# Patient Record
Sex: Male | Born: 1990 | Race: Black or African American | Hispanic: No | Marital: Single | Smoking: Never smoker
Health system: Southern US, Community
[De-identification: ages and names within clinical notes are randomized; demographics above are authoritative.]

## PROBLEM LIST (undated history)

## (undated) DIAGNOSIS — I10 Essential (primary) hypertension: Secondary | ICD-10-CM

## (undated) DIAGNOSIS — R7303 Prediabetes: Secondary | ICD-10-CM

## (undated) DIAGNOSIS — J45909 Unspecified asthma, uncomplicated: Secondary | ICD-10-CM

## (undated) HISTORY — PX: TYMPANOSTOMY TUBE PLACEMENT: SHX32

## (undated) HISTORY — PX: TONSILLECTOMY: SUR1361

---

## 2019-10-30 ENCOUNTER — Encounter: Payer: Self-pay | Admitting: Emergency Medicine

## 2019-10-30 ENCOUNTER — Other Ambulatory Visit: Payer: Self-pay

## 2019-10-30 ENCOUNTER — Ambulatory Visit
Admission: EM | Admit: 2019-10-30 | Discharge: 2019-10-30 | Disposition: A | Discharge status: Home / Self Care | Payer: Self-pay

## 2019-10-30 DIAGNOSIS — H6122 Impacted cerumen, left ear: Secondary | ICD-10-CM

## 2019-10-30 DIAGNOSIS — H6983 Other specified disorders of Eustachian tube, bilateral: Secondary | ICD-10-CM

## 2019-10-30 DIAGNOSIS — R42 Dizziness and giddiness: Secondary | ICD-10-CM

## 2019-10-30 DIAGNOSIS — R0981 Nasal congestion: Secondary | ICD-10-CM

## 2019-10-30 HISTORY — DX: Essential (primary) hypertension: I10

## 2019-10-30 HISTORY — DX: Prediabetes: R73.03

## 2019-10-30 MED ORDER — MECLIZINE HCL 25 MG PO TABS: 25.0000 mg | ORAL_TABLET | Freq: Three times a day (TID) | ORAL | 0 refills | Status: AC | PRN

## 2019-10-30 NOTE — ED Provider Notes (Signed)
[facility]    CSN: [id number] Arrival date & time: 10/30/19  1407      History   Chief Complaint Chief Complaint  Patient presents with  . Sinus Problem  . Dizziness    HPI Miguel Kent is a 29 y.o. male presenting for 5-day history of sinus pressure, nasal congestion and bilateral ear pressure.  He says that the left ear is worse than the right.  He says he feels little dizzy at times especially with certain movements of his head.  He denies any fever, cough, chest pain or breathing difficulty.  Patient does have a history of asthma, hypertension, T2DM, and obesity.  He says he has been taking over-the-counter Mucinex with improvement in his symptoms.  He says that he is actually feeling better.  He says that he thinks amoxicillin will help him feel better faster and he request that today.  Patient denies any known Covid exposure.  He has not been vaccinated for Covid.  He declines any Covid testing today because he says he is afraid to have the test done.  Patient has no other concerns today.  HPI  Past Medical History:  Diagnosis Date  . Hypertension   . Pre-diabetes     There are no problems to display for this patient.   Past Surgical History:  Procedure Laterality Date  . TONSILLECTOMY    . TYMPANOSTOMY TUBE PLACEMENT         Home Medications    Prior to Admission medications   Medication Sig Start Date End Date Taking? Authorizing Provider  atenolol-chlorthalidone (TENORETIC) 100-25 MG tablet Take 1 tablet by mouth daily.   Yes [provider]  losartan (COZAAR) 50 MG tablet Take 50 mg by mouth daily.   Yes [provider]  metFORMIN (GLUCOPHAGE) 850 MG tablet Take 850 mg by mouth 2 (two) times daily with a meal.   Yes [provider]  meclizine (ANTIVERT) 25 MG tablet Take 1 tablet (25 mg total) by mouth 3 (three) times daily as needed for up to 10 days for dizziness. 10/30/19 11/09/19  Shirlee Latch, PA-C    Family  History Family History  Problem Relation Age of Onset  . Healthy Mother   . Hypertension Father     Social History Social History   Tobacco Use  . Smoking status: Never Smoker  . Smokeless tobacco: Never Used  Vaping Use  . Vaping Use: Never used  Substance Use Topics  . Alcohol use: Not Currently  . Drug use: Not Currently     Allergies   Peanut oil   Review of Systems Review of Systems  Constitutional: Negative for fatigue and fever.  HENT: Positive for congestion, ear pain, rhinorrhea, sinus pressure and sinus pain. Negative for sore throat.   Respiratory: Negative for cough and shortness of breath.   Gastrointestinal: Negative for abdominal pain, diarrhea, nausea and vomiting.  Musculoskeletal: Negative for myalgias.  Neurological: Positive for dizziness. Negative for weakness, light-headedness and headaches.  Hematological: Negative for adenopathy.     Physical Exam Triage Vital Signs ED Triage Vitals  Enc Vitals Group     BP 10/30/19 1442 (!) 151/116     Pulse Rate 10/30/19 1442 100     Resp --      Temp 10/30/19 1442 98 F (36.7 C)     Temp Source 10/30/19 1442 Oral     SpO2 10/30/19 1442 100 %     Weight --  Height --      Head Circumference --      Peak Flow --      Pain Score 10/30/19 1437 7     Pain Loc --      Pain Edu? --      Excl. in GC? --    No data found.  Updated Vital Signs BP (!) 151/116 (BP Location: Left Arm)   Pulse 100   Temp 98 F (36.7 C) (Oral)   SpO2 100%       Physical Exam Vitals and nursing note reviewed.  Constitutional:      General: He is not in acute distress.    Appearance: Normal appearance. He is well-developed. He is obese. He is not ill-appearing, toxic-appearing or diaphoretic.  HENT:     Head: Normocephalic and atraumatic.     Right Ear: Tympanic membrane, ear canal and external ear normal.     Left Ear: External ear normal. There is impacted cerumen.     Nose: Congestion and rhinorrhea  present.     Mouth/Throat:     Pharynx: Uvula midline. No oropharyngeal exudate or posterior oropharyngeal erythema.     Tonsils: No tonsillar abscesses.  Eyes:     General: No scleral icterus.       Right eye: No discharge.        Left eye: No discharge.     Conjunctiva/sclera: Conjunctivae normal.     Pupils: Pupils are equal, round, and reactive to light.  Neck:     Thyroid: No thyromegaly.     Trachea: No tracheal deviation.  Cardiovascular:     Rate and Rhythm: Normal rate and regular rhythm.     Heart sounds: Normal heart sounds.  Pulmonary:     Effort: Pulmonary effort is normal. No respiratory distress.     Breath sounds: Normal breath sounds. No stridor. No wheezing or rales.  Chest:     Chest wall: No tenderness.  Musculoskeletal:     Cervical back: Normal range of motion and neck supple.  Lymphadenopathy:     Cervical: No cervical adenopathy.  Skin:    General: Skin is warm and dry.     Findings: No rash.  Neurological:     General: No focal deficit present.     Mental Status: He is alert. Mental status is at baseline.     Motor: No weakness.     Gait: Gait normal.  Psychiatric:        Mood and Affect: Mood normal.        Behavior: Behavior normal.        Thought Content: Thought content normal.      UC Treatments / Results  Labs (all labs ordered are listed, but only abnormal results are displayed) Labs Reviewed - No data to display  EKG   Radiology No results found.  Procedures Procedures (including critical care time)  Medications Ordered in UC Medications - No data to display  Initial Impression / Assessment and Plan / UC Course  I have reviewed the triage vital signs and the nursing notes.  Pertinent labs & imaging results that were available during my care of the patient were reviewed by me and considered in my medical decision making (see chart for details).   Patient presenting for sinus congestion and dizziness over the past few days.   Patient is high risk for Covid since he does have type 2 diabetes, hypertension, asthma, obesity and has not been vaccinated.  Spoke with  patient for about 10 minutes encouraging him to get Covid tested.  Patient declined stating that he was afraid to have the test done.  I discussed how the procedure is done and that it is not painful.  Advised him that since he is not vaccinated he is high risk for poor outcomes if he were to test positive for Covid, but he could benefit from medical antibody infusion therapy if positive and he get in within the first 7 days.  Patient again expressed that he did not want to get Covid tested and would consider it may be in the future.  Again, I spent much time discussing with him the importance of getting tested.  A week lavage performed on the left ear.  After all the cerumen removed, I could see the TM which showed small effusion without infection.  Treating patient at this time for eustachian tube dysfunction and otitis media effusion with meclizine.  Also encouraged him to continue use of nasal sprays.  Strongly encourage patient to follow back up with [place] for any new or worsening symptoms including fever, fatigue, cough, breathing difficulty, etc.  Patient agreeable.   Final Clinical Impressions(s) / UC Diagnoses   Final diagnoses:  Dysfunction of both eustachian tubes  Dizziness  Sinus congestion  Impacted cerumen of left ear   Discharge Instructions   None    ED Prescriptions    Medication Sig Dispense Auth. Provider   meclizine (ANTIVERT) 25 MG tablet Take 1 tablet (25 mg total) by mouth 3 (three) times daily as needed for up to 10 days for dizziness. 30 tablet Gareth Morgan     PDMP not reviewed this encounter.   Shirlee Latch, PA-C 10/31/19 2254

## 2019-10-30 NOTE — ED Triage Notes (Addendum)
Pt c/o sinus pressure, nasal congestion, productive cough with clear mucous, and dizziness onset last Saturday. Pt denies fever. Pt has been taking mucinex which has been helping. Pt declines covid testing today.

## 2020-03-01 ENCOUNTER — Encounter: Payer: Self-pay | Admitting: Physician Assistant

## 2020-03-01 ENCOUNTER — Other Ambulatory Visit: Payer: Self-pay

## 2020-03-01 ENCOUNTER — Ambulatory Visit: Payer: BC Managed Care – PPO | Admitting: Physician Assistant

## 2020-03-01 DIAGNOSIS — Z7689 Persons encountering health services in other specified circumstances: Secondary | ICD-10-CM

## 2020-03-01 DIAGNOSIS — J4520 Mild intermittent asthma, uncomplicated: Secondary | ICD-10-CM | POA: Diagnosis not present

## 2020-03-01 DIAGNOSIS — R7301 Impaired fasting glucose: Secondary | ICD-10-CM

## 2020-03-01 DIAGNOSIS — I10 Essential (primary) hypertension: Secondary | ICD-10-CM | POA: Diagnosis not present

## 2020-03-01 DIAGNOSIS — Z111 Encounter for screening for respiratory tuberculosis: Secondary | ICD-10-CM

## 2020-03-01 DIAGNOSIS — E1165 Type 2 diabetes mellitus with hyperglycemia: Secondary | ICD-10-CM | POA: Diagnosis not present

## 2020-03-01 DIAGNOSIS — G479 Sleep disorder, unspecified: Secondary | ICD-10-CM | POA: Diagnosis not present

## 2020-03-01 DIAGNOSIS — R5383 Other fatigue: Secondary | ICD-10-CM

## 2020-03-01 LAB — POCT GLYCOSYLATED HEMOGLOBIN (HGB A1C): Hemoglobin A1C: 7.5 % — AB (ref 4.0–5.6)

## 2020-03-01 MED ORDER — AMLODIPINE BESYLATE 5 MG PO TABS: 5.0000 mg | ORAL_TABLET | Freq: Every day | ORAL | 2 refills | Status: DC

## 2020-03-01 MED ORDER — ALBUTEROL SULFATE HFA 108 (90 BASE) MCG/ACT IN AERS: 2.0000 | INHALATION_SPRAY | Freq: Four times a day (QID) | RESPIRATORY_TRACT | 0 refills | Status: DC | PRN

## 2020-03-01 NOTE — Progress Notes (Signed)
[facility] [address] [place], [place] [address]  Internal MEDICINE  Office Visit Note  Patient Name: Miguel Kent  [id number]  [id number]  Date of Service: 03/02/2020   Complaints/HPI Pt is here for establishment of PCP. Chief Complaint  Patient presents with  . New Patient (Initial Visit)  . Hypertension   HPI  Pt is here to establish care. He was seen at [facility] previously who started him on BP meds and is switching offices now that he has insurance. 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 He has not been taking Losartan due to headaches. He is still taking tenoretic. He has also not been taking the Metformin. Also has asthma and has been using albuterol inhaler every other day. He has a nebulizer but has not used it. No maintenance inhaler used currently. He additionally reports that he snores and wakes up gasping. He has some sleepiness during the day.   Current Medication: Outpatient Encounter Medications as of 03/01/2020  Medication Sig  . albuterol (VENTOLIN HFA) 108 (90 Base) MCG/ACT inhaler Inhale 2 puffs into the lungs every 6 (six) hours as needed for wheezing or shortness of breath.  [name] amLODipine (NORVASC) 5 MG tablet Take 1 tablet (5 mg total) by mouth daily.  [name] atenolol-chlorthalidone (TENORETIC) 100-25 MG tablet Take 1 tablet by mouth daily.  [name] losartan (COZAAR) 50 MG tablet Take 50 mg by mouth daily.  . metFORMIN (GLUCOPHAGE) 850 MG tablet Take 850 mg by mouth 2 (two) times daily with a meal.   No facility-administered encounter medications on file as of 03/01/2020.    Surgical History: Past Surgical History:  Procedure Laterality Date  . TONSILLECTOMY    . TYMPANOSTOMY TUBE PLACEMENT      Medical History: Past Medical History:  Diagnosis Date  . Hypertension   . Pre-diabetes     Family History: Family History  Problem Relation Age of Onset  . Healthy Mother   . Hypertension Father      Social History   Socioeconomic History  . Marital status: Single    Spouse name: Not on file  . Number of children: Not on file  . Years of education: Not on file  . Highest education level: Not on file  Occupational History  . Not on file  Tobacco Use  . Smoking status: Never Smoker  . Smokeless tobacco: Never Used  Vaping Use  . Vaping Use: Never used  Substance and Sexual Activity  . Alcohol use: Never  . Drug use: Never  . Sexual activity: Not on file  Other Topics Concern  . Not on file  Social History Narrative  . Not on file   Social Determinants of Health   Financial Resource Strain: Not on file  Food Insecurity: Not on file  Transportation Needs: Not on file  Physical Activity: Not on file  Stress: Not on file  Social Connections: Not on file  Intimate Partner Violence: Not on file     Review of Systems  Constitutional: Negative for chills, fatigue and unexpected weight change.  HENT: Negative for congestion, hearing loss, postnasal drip, rhinorrhea, sneezing and sore throat.   Eyes: Negative for redness and visual disturbance.  Respiratory: Negative for cough, chest tightness and shortness of breath.   Cardiovascular: Negative for chest pain and palpitations.  Gastrointestinal: Negative for abdominal pain, constipation, diarrhea, nausea and vomiting.  Genitourinary: Negative for dysuria and frequency.  Musculoskeletal: Negative for arthralgias,  back pain, joint swelling and neck pain.  Skin: Negative for rash.  Neurological: Positive for headaches. Negative for tremors and numbness.  Hematological: Negative for adenopathy. Does not bruise/bleed easily.  Psychiatric/Behavioral: Positive for sleep disturbance. Negative for behavioral problems (Depression) and suicidal ideas. The patient is not nervous/anxious.     Vital Signs: BP (!) 177/111   Pulse 80   Temp (!) 97.5 F (36.4 C)   Resp 16   Ht 6' (1.829 m)   Wt 261 lb (118.4 kg)   SpO2 96%    BMI 35.40 kg/m    Physical Exam Constitutional:      General: He is not in acute distress.    Appearance: He is well-developed. He is obese. He is not diaphoretic.  HENT:     Head: Normocephalic and atraumatic.     Mouth/Throat:     Pharynx: No oropharyngeal exudate.  Eyes:     Pupils: Pupils are equal, round, and reactive to light.  Neck:     Thyroid: No thyromegaly.     Vascular: No JVD.     Trachea: No tracheal deviation.  Cardiovascular:     Rate and Rhythm: Normal rate and regular rhythm.     Heart sounds: Normal heart sounds. No murmur heard. No friction rub. No gallop.   Pulmonary:     Effort: Pulmonary effort is normal. No respiratory distress.     Breath sounds: No wheezing or rales.  Chest:     Chest wall: No tenderness.  Abdominal:     General: Bowel sounds are normal.     Palpations: Abdomen is soft.  Musculoskeletal:        General: Normal range of motion.     Cervical back: Normal range of motion and neck supple.  Lymphadenopathy:     Cervical: No cervical adenopathy.  Skin:    General: Skin is warm and dry.  Neurological:     Mental Status: He is alert and oriented to person, place, and time.     Cranial Nerves: No cranial nerve deficit.  Psychiatric:        Behavior: Behavior normal.        Thought Content: Thought content normal.        Judgment: Judgment normal.       Assessment/Plan: 1. Uncontrolled type 2 diabetes mellitus with hyperglycemia (HCC) - POCT HgB A1C is 7.5 today. Per patient he has metformin at home, but has not been taking it. He will restart today. Will consider starting trulicty 1x/wk instead.  2. Uncontrolled hypertension He has not been taking losartan due to headaches and is fearful of taking an ACE due to be told that it can cause angioedema. He will continue atenolol-chlorthalidone 100-25mg  and add Norvasc daily. Needs recheck of BP in a few days before he can be cleared for his job paperwork. - amLODipine (NORVASC) 5 MG  tablet; Take 1 tablet (5 mg total) by mouth daily.  Dispense: 30 tablet; Refill: 2  3. Mild intermittent asthma, unspecified whether complicated Pt will bring in information of previous maintenance inhaler and nebulizer information. May need spiro next visit to check lung function. - albuterol (VENTOLIN HFA) 108 (90 Base) MCG/ACT inhaler; Inhale 2 puffs into the lungs every 6 (six) hours as needed for wheezing or shortness of breath.  Dispense: 8 g; Refill: 0  4. Sleep disturbance - PSG SLEEP STUDY; Future. Patient has sign and symptoms of OSA ( disturbed sleep, excessive fatigue during the day, uncontrolled bp and abnormal  BMI). Baseline sleep study is ordered to further look into this. Long term complications of OSA was addressed with the patient.  5. Fatigue, unspecified type Ordered age appropriate lab to be done prior to physical. - PSG SLEEP STUDY; Future - CBC with Differential/Platelet; Future - Lipid Panel With LDL/HDL Ratio; Future - TSH; Future - T4, free; Future - Comprehensive metabolic panel  6. Encounter for TB tine test Pt instructed to go to [facility] to have this done. - QuantiFERON-TB Gold Plus  7. Encounter to establish care Pt has paperwork to be signed to begin new job. He will go for TB Quant test ASAP and obtain remaining labwork prior to physical. He know she needs to obtain vaccination records for his paperwork to be signed off and must have BP better controlled at f/u in 2 days. - QuantiFERON-TB Gold Plus - CBC with Differential/Platelet; Future - Lipid Panel With LDL/HDL Ratio; Future - TSH; Future - T4, free; Future - Comprehensive metabolic panel General Counseling: Zakaria verbalizes understanding of the findings of todays visit and agrees with plan of treatment. I have discussed any further diagnostic evaluation that may be needed or ordered today. We also reviewed his medications today. he has been encouraged to call the office with any questions or  concerns that should arise related to todays visit.  Counseling: Hypertension Counseling:   The following hypertensive lifestyle modification were recommended and discussed:  1. Limiting alcohol intake to less than 1 oz/day of ethanol:(24 oz of beer or 8 oz of wine or 2 oz of 100-proof whiskey). 2. Take baby ASA 81 mg daily. 3. Importance of regular aerobic exercise and losing weight. 4. Reduce dietary saturated fat and cholesterol intake for overall cardiovascular health. 5. Maintaining adequate dietary potassium, calcium, and magnesium intake. 6. Regular monitoring of the blood pressure. 7. Reduce sodium intake to less than 100 mmol/day (less than 2.3 gm of sodium or less than 6 gm of sodium choride)    Orders Placed This Encounter  Procedures  . QuantiFERON-TB Gold Plus  . CBC with Differential/Platelet  . Lipid Panel With LDL/HDL Ratio  . TSH  . T4, free  . Comprehensive metabolic panel  . POCT HgB A1C  . PSG SLEEP STUDY    Meds ordered this encounter  Medications  . albuterol (VENTOLIN HFA) 108 (90 Base) MCG/ACT inhaler    Sig: Inhale 2 puffs into the lungs every 6 (six) hours as needed for wheezing or shortness of breath.    Dispense:  8 g    Refill:  0  . amLODipine (NORVASC) 5 MG tablet    Sig: Take 1 tablet (5 mg total) by mouth daily.    Dispense:  30 tablet    Refill:  2    Time spent:30 Minutes

## 2020-03-04 ENCOUNTER — Ambulatory Visit: Payer: Self-pay | Admitting: Physician Assistant

## 2020-03-23 ENCOUNTER — Encounter (INDEPENDENT_AMBULATORY_CARE_PROVIDER_SITE_OTHER): Payer: BC Managed Care – PPO | Admitting: Internal Medicine

## 2020-03-23 DIAGNOSIS — G4733 Obstructive sleep apnea (adult) (pediatric): Secondary | ICD-10-CM

## 2020-04-04 NOTE — Procedures (Signed)
[facility]  Polysomnogram Report Part I                                                               Phone: [phone] Fax: [phone]  Patient Name: Miguel Kent, Miguel Kent: [id number]  Date of Birth: [birth year] Acquisition Date: 03/23/2020  Referring Physician: Leeanne Deed, NP     History: The patient is a 30 year old male who was referred for evaluation of possible sleep apnea. Medical History: hypertension, sleep apnea.  Medications: Ventolin, Norvasc, Cozaar, tenoretic.  Procedure: This routine overnight polysomnogram was performed on the [facility] 5 using the standard diagnostic protocol. This included 6 channels of EEG, 2 channels of EOG, chin EMG, bilateral anterior tibialis EMG, nasal/oral thermistor, PTAF (nasal pressure transducer), chest and abdominal wall movements, EKG, and pulse oximetry.  Description: The total recording time was 426.2 minutes. The total sleep time was 304.0 minutes. There were a total of 99.6 minutes of wakefulness after sleep onset for a reduced?sleep efficiency of 71.3%. The latency to sleep onset was within normal limits?? at 22.6 minutes. The R sleep onset latency was prolonged at 139.0 minutes. Sleep parameters, as a percentage of the total sleep time, demonstrated 5.9% of sleep was in N1 sleep, 75.7% N2, 8.2% N3 and 10.2% R sleep. There were a total of 26 arousals for an arousal index of 5.1 arousals per hour of sleep that was normal.???  Respiratory monitoring demonstrated nearly continuous mild to severe degree of snoring in all positions. There were 237 apneas and hypopneas for an Apnea Hypopnea Index of 46.8 apneas and hypopneas per hour of sleep. The REM related apnea hypopnea index was 56.1/hr of REM sleep compared to a NREM AHI of 45.5/hr. The Respiratory Disturbance Index, which includes 6 respiratory effort related arousals (RERAs), was 48.0 respiratory events per hour of sleep.  The average  duration of the respiratory events was 27.6 seconds with a maximum duration of 91.5 seconds. The respiratory events occurred in all positions, however they were more frequent in the supine position with an AHI of 61.0. The respiratory events were associated with peripheral oxygen desaturations on the average to 87%. The lowest oxygen desaturation associated with a respiratory event was 50%. Additionally, the baseline oxygen saturation during wakefulness was 96%, during NREM sleep averaged 92%, and during REM sleep averaged  86%. The total duration of oxygen < 90% was 57.2 minutes and <80% was 8.3 minutes.  Cardiac monitoring- did demonstrate transient cardiac decelerations associated with the apneas. There were no significant cardiac rhythm irregularities.   Periodic limb movement monitoring- did not demonstrate periodic limb movements.     Impression: ???This routine overnight polysomnogram demonstrated significant obstructive sleep apnea with an overall Apnea Hypopnea Index of 46.8 apneas and hypopneas per hour of sleep, which increased to 61.0 in the supine position.  There was a reduced sleep efficiency with??increased awakenings?and reduced percentages of REM and slow wave sleep. ?These findings would appear to be due to the obstructive sleep apnea.???  ??Recommendations:    1. A CPAP titration would be recommended due to the severity of the sleep apnea. Some supine sleep should be ensured to optimize the titration. 2. Additionally,  would recommend weight loss in a patient with a BMI of 34.4.     Yevonne Pax, MD, [facility] Diplomate ABMS-Pulmonary, Critical Care and Sleep Medicine  Electronically reviewed and digitally signed     [facility] Polysomnogram Report Part II  Phone: [phone] Fax: [phone]  Patient last name [name] Neck Size 18.5 in. Acquisition [phone]  Patient first name Miguel Kent Weight 261.0 lbs. Started 03/23/2020 at 10:05:07 PM  Birth date  1990/12/07 Height 73.0 in. Stopped 03/24/2020 at 5:20:25 AM  Age 72 BMI 34.4 lb/in2 Duration 426.2  Study Type Adult      Report generated by: Hampton Abbot, RPSGT Sleep Data: Lights Out: 10:08:31 PM Sleep Onset: 10:31:07 PM  Lights On: 5:14:43 AM Sleep Efficiency: 71.3 %  Total Recording Time: 426.2 min Sleep Latency (from Lights Off) 22.6 min  Total Sleep Time (TST): 304.0 min R Latency (from Sleep Onset): 139.0 min  Sleep Period Time: 388.5 min Total Kent of awakenings: 17  Wake during sleep: 84.5 min Wake After Sleep Onset (WASO): 99.6 min   Sleep Data:         Arousal Summary: Stage  Latency from lights out (min) Latency from sleep onset (min) Duration (min) % Total Sleep Time  Normal values  N 1 22.6 0.0 18.0 5.9 (5%)  N 2 23.1 0.5 230.0 75.7 (50%)  N 3 139.6 117.0 25.0 8.2 (20%)  R 161.6 139.0 31.0 10.2 (25%)    Kent Index  Spontaneous 39 7.7  Apneas & Hypopneas 43 8.5  RERAs 6 1.2       (Apneas & Hypopneas & RERAs)  (49) (9.7)  Limb Movement 0 0.0  Snore 0 0.0  TOTAL 88 17.4      Respiratory Data:  CA OA MA Apnea Hypopnea* A+ H RERA Total  Kent 0 35 1 36 201 237 6 243  Mean Dur (sec) 0.0 21.1 12.0 20.8 29.0 27.8 22.5 27.6  Max Dur (sec) 0.0 91.5 12.0 91.5 59.0 91.5 30.0 91.5  Total Dur (min) 0.0 12.3 0.2 12.5 97.2 109.7 2.3 112.0  % of TST 0.0 4.0 0.1 4.1 32.0 36.1 0.7 36.8  Index (#/h TST) 0.0 6.9 0.2 7.1 39.7 46.8 1.2 48.0  *Hypopneas scored based on 4% or greater desaturation.  Sleep Stage:        REM NREM TST  AHI 56.1 45.5 46.8  RDI 56.1 46.8 48.0            Body Position Data:  Sleep (min) TST (%) REM (min) NREM (min) CA (#) OA (#) MA (#) HYP (#) AHI (#/h) RERA (#) RDI (#/h) Desat (#)  Supine 100.3 32.99 1.5 98.8 0 4 0 98 61.0 1 61.6 109  Non-Supine 203.70 67.01 29.50 174.20 0.00 31.00 1.00 103.00 39.76 5.00 41.24 139.00  Right: 203.7 67.01 29.5 174.2 0 31 1 103 39.8 5 41.2 139     Snoring: Total Kent of snoring episodes   0  Total time with snoring    min (   % of sleep)   Oximetry Distribution:             WK REM NREM TOTAL  Average (%)   96 86 92 93  < 90% 1.1 15.5 40.6 57.2  < 80% 0.7 6.9 0.7 8.3  < 70% 0.7 2.6 0.6 3.9  # of Desaturations* 3 34 211 248  Desat Index (#/hour) 1.6 65.8 46.4 49.0  Desat Max (%) 6 42 35 42  Desat Max Dur (  sec) 91.0 113.0 107.0 113.0  Approx Min O2 during sleep 50  Approx min O2 during a respiratory event 50  Was Oxygen added (Y/N) and final rate No:   0 LPM  *Desaturations based on 3% or greater drop from baseline.   Cheyne Stokes Breathing: None Present   Heart Rate Summary:  Average Heart Rate During Sleep 74.4 bpm      Highest Heart Rate During Sleep (95th %) 85.0 bpm      Highest Heart Rate During Sleep 190 bpm (artifact)  Highest Heart Rate During Recording (TIB) 190 bpm (artifact)   Heart Rate Observations: Event Type # Events   Bradycardia 0 Lowest HR Scored: N/A  Sinus Tachycardia During Sleep 0 Highest HR Scored: N/A  Narrow Complex Tachycardia 0 Highest HR Scored: N/A  Wide Complex Tachycardia 0 Highest HR Scored: N/A  Asystole 0 Longest Pause: N/A  Atrial Fibrillation 0 Duration Longest Event: N/A  Other Arrythmias  No Type:    Periodic Limb Movement Data: (Primary legs unless otherwise noted) Total # Limb Movement 0 Limb Movement Index 0.0  Total # PLMS    PLMS Index     Total # PLMS Arousals    PLMS Arousal Index     Percentage Sleep Time with PLMS   min (   % sleep)  Mean Duration limb movements (secs)

## 2020-04-15 ENCOUNTER — Encounter: Payer: Self-pay | Admitting: Physician Assistant

## 2020-04-22 ENCOUNTER — Other Ambulatory Visit: Payer: Self-pay

## 2020-04-22 ENCOUNTER — Ambulatory Visit: Payer: BC Managed Care – PPO | Admitting: Hospice and Palliative Medicine

## 2020-04-22 ENCOUNTER — Encounter: Payer: Self-pay | Admitting: Hospice and Palliative Medicine

## 2020-04-22 VITALS — BP 140/90 | HR 79 | Temp 97.7°F | Resp 16 | Ht 72.0 in | Wt 262.4 lb

## 2020-04-22 DIAGNOSIS — R5383 Other fatigue: Secondary | ICD-10-CM | POA: Diagnosis not present

## 2020-04-22 DIAGNOSIS — I10 Essential (primary) hypertension: Secondary | ICD-10-CM

## 2020-04-22 DIAGNOSIS — G4733 Obstructive sleep apnea (adult) (pediatric): Secondary | ICD-10-CM | POA: Diagnosis not present

## 2020-04-22 NOTE — Progress Notes (Signed)
[facility] [address] [place], [place] [address]  Internal MEDICINE  Office Visit Note  Patient Name: Miguel Kent  [id number]  [id number]  Date of Service: 04/23/2020  Chief Complaint  Patient presents with  . Follow-up    Sleep apnea, ss results     HPI Patient is here for routine follow-up Wanting to discuss his PSG results, upset that no one has called to reach out about results yet Had a follow-up with ENT whom explained that based on his anatomy his OSA is likely severe which concerned him and his mother ENT feels his symptoms of ear popping and headaches are likely exacerbated by severe OSA and will improve with CPAP use  PSG results overall AHI 46.8 which increased in supine position to 61.0, lowest SpO2 50%   BP improved since starting amlodipine at last visit Has not yet had a chance to have his labs completed yet   Current Medication: Outpatient Encounter Medications as of 04/22/2020  Medication Sig  . albuterol (VENTOLIN HFA) 108 (90 Base) MCG/ACT inhaler Inhale 2 puffs into the lungs every 6 (six) hours as needed for wheezing or shortness of breath.  [name] amLODipine (NORVASC) 5 MG tablet Take 1 tablet (5 mg total) by mouth daily.  [name] atenolol-chlorthalidone (TENORETIC) 100-25 MG tablet Take 1 tablet by mouth daily.  . metFORMIN (GLUCOPHAGE) 850 MG tablet Take 850 mg by mouth 2 (two) times daily with a meal.  . [DISCONTINUED] losartan (COZAAR) 50 MG tablet Take 50 mg by mouth daily. (Patient not taking: Reported on 04/22/2020)   No facility-administered encounter medications on file as of 04/22/2020.    Surgical History: Past Surgical History:  Procedure Laterality Date  . TONSILLECTOMY    . TYMPANOSTOMY TUBE PLACEMENT      Medical History: Past Medical History:  Diagnosis Date  . Hypertension   . Pre-diabetes     Family History: Family History  Problem Relation Age of Onset  . Healthy Mother   . Hypertension Father     Social History    Socioeconomic History  . Marital status: Single    Spouse name: Not on file  . Number of children: Not on file  . Years of education: Not on file  . Highest education level: Not on file  Occupational History  . Not on file  Tobacco Use  . Smoking status: Never Smoker  . Smokeless tobacco: Never Used  Vaping Use  . Vaping Use: Never used  Substance and Sexual Activity  . Alcohol use: Never  . Drug use: Never  . Sexual activity: Not on file  Other Topics Concern  . Not on file  Social History Narrative  . Not on file   Social Determinants of Health   Financial Resource Strain: Not on file  Food Insecurity: Not on file  Transportation Needs: Not on file  Physical Activity: Not on file  Stress: Not on file  Social Connections: Not on file  Intimate Partner Violence: Not on file      Review of Systems  Constitutional: Positive for fatigue. Negative for chills and unexpected weight change.  HENT: Negative for congestion, postnasal drip, rhinorrhea, sneezing and sore throat.   Eyes: Negative for redness.  Respiratory: Negative for cough, chest tightness and shortness of breath.   Cardiovascular: Negative for chest pain and palpitations.  Gastrointestinal: Negative for abdominal pain, constipation, diarrhea, nausea and vomiting.  Genitourinary: Negative for dysuria and frequency.  Musculoskeletal: Negative for arthralgias, back pain, joint swelling and  neck pain.  Skin: Negative for rash.  Neurological: Positive for headaches. Negative for tremors and numbness.  Hematological: Negative for adenopathy. Does not bruise/bleed easily.  Psychiatric/Behavioral: Negative for behavioral problems (Depression), sleep disturbance and suicidal ideas. The patient is not nervous/anxious.     Vital Signs: BP 140/90   Pulse 79   Temp 97.7 F (36.5 C)   Resp 16   Ht 6' (1.829 m)   Wt 262 lb 6.4 oz (119 kg)   SpO2 98%   BMI 35.59 kg/m    Physical Exam Vitals reviewed.   Constitutional:      Appearance: He is normal weight.  HENT:     Nose: Congestion present.  Cardiovascular:     Rate and Rhythm: Normal rate and regular rhythm.     Pulses: Normal pulses.     Heart sounds: Normal heart sounds.  Pulmonary:     Effort: Pulmonary effort is normal.     Breath sounds: Normal breath sounds.  Abdominal:     General: Abdomen is flat.     Palpations: Abdomen is soft.  Musculoskeletal:        General: Normal range of motion.     Cervical back: Normal range of motion.  Skin:    General: Skin is warm.  Neurological:     General: No focal deficit present.     Mental Status: He is alert and oriented to person, place, and time. Mental status is at baseline.  Psychiatric:        Mood and Affect: Mood normal.        Behavior: Behavior normal.        Thought Content: Thought content normal.        Judgment: Judgment normal.    Assessment/Plan: 1. OSA (obstructive sleep apnea) Due to severity of OSA will expedite process of getting him set up for CPAP titration study - Cpap titration; Future  2. Essential hypertension BP better controlled with addition of amlodipine, continue to monitor  3. Other fatigue - CBC with Differential/Platelet - Comprehensive metabolic panel - Lipid Panel With LDL/HDL Ratio - TSH + free T4  General Counseling: Jonan verbalizes understanding of the findings of todays visit and agrees with plan of treatment. I have discussed any further diagnostic evaluation that may be needed or ordered today. We also reviewed his medications today. he has been encouraged to call the office with any questions or concerns that should arise related to todays visit.    Orders Placed This Encounter  Procedures  . CBC with Differential/Platelet  . Comprehensive metabolic panel  . Lipid Panel With LDL/HDL Ratio  . TSH + free T4  . Cpap titration   Time spent: 30 Minutes Time spent includes review of chart, medications, test results and  follow-up plan with the patient.  This patient was seen by Leeanne Deed AGNP-C in Collaboration with Dr Lyndon Code as a part of collaborative care agreement     Lubertha Basque. Magdalynn Davilla AGNP-C Internal medicine

## 2020-04-23 ENCOUNTER — Encounter: Payer: Self-pay | Admitting: Hospice and Palliative Medicine

## 2020-05-06 ENCOUNTER — Encounter (INDEPENDENT_AMBULATORY_CARE_PROVIDER_SITE_OTHER): Payer: BC Managed Care – PPO | Admitting: Internal Medicine

## 2020-05-06 DIAGNOSIS — G4733 Obstructive sleep apnea (adult) (pediatric): Secondary | ICD-10-CM | POA: Diagnosis not present

## 2020-05-09 NOTE — Procedures (Signed)
[facility]  Polysomnogram Report Part I  Phone: [phone] Fax: [phone]  Patient Name: Miguel Kent, Miguel Kent Acquisition Number: [id number]  Date of Birth: 05/05/1990 Acquisition Date: 05/06/2020  Referring Physician: Leeanne Deed, NP     History: The patient is a 30 year old male with obstructive sleep apnea for CPAP titration. Medical History: hypertension, sleep apnea.  Medications: Ventolin, Norvasc, Cozaar, tenoretic.  Procedure: This routine overnight polysomnogram was performed on the [facility] 5 using the standard CPAP protocol. This included 6 channels of EEG, 2 channels of EOG, chin EMG, bilateral anterior tibialis EMG, nasal/oral thermistor, PTAF (nasal pressure transducer), chest and abdominal wall movements, EKG, and pulse oximetry.  Description: The total recording time was 452.5 minutes. The total sleep time was 325.7 minutes. There were a total of 114.0 minutes of wakefulness after sleep onset for a?reduced????sleep efficiency of 72.0%. The latency to sleep onset was within normal limits?at 12.8 minutes. The R sleep onset latency was within normal limits at 110.5 minutes.?? Sleep parameters, as a percentage of the total sleep time, demonstrated 2.8% of sleep was in N1 sleep, 40.1% N2, 27.6% N3 and 29.5% R sleep. There were a total of 33 arousals for an arousal index of 6.1 arousals per hour of sleep that was normal.???  Overall, there were a total of 120 respiratory events for a respiratory disturbance index, which includes apneas, hypopneas and RERAs (increased respiratory effort) of 22.1 respiratory events per hour of sleep during the pressure titration. CPAP was initiated at 5 cm H2O at lights out, 9:33 p.m. It was titrated in 1-2 cm increments for snoring and REM related obstructive events to the final pressure of 10 cm H2O. Obstructive events persisted in REM sleep at this pressure. The patient had difficulty tolerating the pressure  Additionally,  the baseline oxygen saturation during wakefulness was 96%, during NREM sleep averaged 93%, and during REM sleep averaged 91%. The total duration of oxygen < 90% was 28.1 minutes and <80% was 4.0 minutes.  Cardiac monitoring- intermittent PVCs were observed throughout the study.   Periodic limb movement monitoring- did not demonstrate periodic limb movements.   Impression: This patient's obstructive sleep apnea was not fully controlled at the final pressure setting of 10 cm H2O. The patient had difficulty tolerating the pressures and a higher pressure could not be titrated. The patient was also very congested when arriving in the laboratory.     Recommendations: 1. Would recommend utilization of nasal CPAP at 6-20 cm H2O. Practice with CPAP for 20-30 minutes daily until acclimated.     2. Treat nasal congesting regularly. 3. A [employer] Simplus mask was used. Chin strap used during study- no. Humidifier used during study- yes.     Yevonne Pax, MD, [facility] Diplomate ABMS-Pulmonary, Critical Care and Sleep Medicine  Electronically reviewed and digitally signed      [facility] CPAP/BIPAP Polysomnogram Report Part II Phone: [phone] Fax: [phone]  Patient last name Ayo Neck Size 18.5 in. Acquisition [id number]  Patient first name Miguel Kent Weight 261.0 lbs. Started 05/06/2020 at 9:28:30 PM  Birth date Mar 20, 1990 Height 73.0 in. Stopped 05/07/2020 at 5:09:36 AM  Age 30      Type Adult BMI 34.4 lb/in2 Duration 452.5  Report generated by: Hampton Abbot, RPSGT Sleep Data: Lights Out: 9:33:12 PM Sleep Onset: 9:46:00 PM  Lights On: 5:05:42 AM Sleep Efficiency: 72.0 %  Total Recording Time: 452.5 min Sleep  Latency (from Lights Off) 12.8 min  Total Sleep Time (TST): 325.7 min R Latency (from Sleep Onset): 110.5 min  Sleep Period Time: 439.7 min Total number of awakenings: 14  Wake during sleep: 114.0 min Wake After Sleep Onset (WASO): 114.0 min   Sleep Data:          Arousal Summary: Stage  Latency from lights out (min) Latency from sleep onset (min) Duration (min) % Total Sleep Time  Normal values  N 1 12.8 0.0 9.0 2.8 (5%)  N 2 13.8 1.0 130.7 40.1 (50%)  N 3 74.3 61.5 90.0 27.6 (20%)  R 123.3 110.5 96.0 29.5 (25%)    Number Index  Spontaneous 34 6.3  Apneas & Hypopneas 5 0.9  RERAs 0 0.0       (Apneas & Hypopneas & RERAs)  (5) (0.9)  Limb Movement 0 0.0  Snore 0 0.0  TOTAL 39 7.2      Respiratory Data:  CA OA MA Apnea Hypopnea* A+ H RERA Total  Number 0 79 0 79 41 120 0 120  Mean Dur (sec) 0.0 16.8 0.0 16.8 21.8 18.5 0.0 18.5  Max Dur (sec) 0.0 48.5 0.0 48.5 37.5 48.5 0.0 48.5  Total Dur (min) 0.0 22.1 0.0 22.1 14.9 37.0 0.0 37.0  % of TST 0.0 6.8 0.0 6.8 4.6 11.4 0.0 11.4  Index (#/h TST) 0.0 14.6 0.0 14.6 7.6 22.1 0.0 22.1  *Hypopneas scored based on 4% or greater desaturation.  Sleep Stage:         REM NREM TST  AHI 62.5 5.2 22.1  RDI 62.5 5.2 22.1    Sleep (min) TST (%) REM (min) NREM (min) CA (#) OA (#) MA (#) HYP (#) AHI (#/h) RERA (#) RDI (#/h) Desat (#)  Supine 128.3 39.39 55.1 73.2 0 59 0 22 37.9 0 37.9 67  Non-Supine 197.40 60.61 40.90 156.50 0.00 20.00 0.00 19.00 11.85 0 11.85 17.00  Right: 197.2 60.55 40.8 156.4 0 20 0 19 11.9 0 11.9 17  UP: 0.2 0.06 0.1 0.1 0 0 0 0 0.0 0 0.00 0     Snoring: Total number of snoring episodes  0  Total time with snoring    min (   % of sleep)   Oximetry Distribution:             WK REM NREM TOTAL  Average (%)   96 91 93 93  < 90% 0.1 25.1 2.9 28.1  < 80% 0.0 4.0 0.0 4.0  < 70% 0.0 0.3 0.0 0.3  # of Desaturations* 0 54 11 65  Desat Index (#/hour) 0.0 34.9 2.9 12.1  Desat Max (%) 0 12 6 12   Desat Max Dur (sec) 0.0 29.0 30.5 30.5  Approx Min O2 during sleep 64  Approx min O2 during a respiratory event 64  Was Oxygen added (Y/N) and final rate No:   0 LPM  *Desaturations based on 3% or greater drop from baseline.   Cheyne Stokes Breathing: None Present    Heart Rate Summary:  Average Heart Rate During Sleep 73.3 bpm      Highest Heart Rate During Sleep (95th %) 85.0 bpm      Highest Heart Rate During Sleep 179 bpm (artifact)  Highest Heart Rate During Recording (TIB) 229 bpm (artifact)   Heart Rate Observations: Event Type # Events   Bradycardia 0 Lowest HR Scored: N/A  Sinus Tachycardia During Sleep 0 Highest HR Scored: N/A  Narrow Complex Tachycardia 0 Highest  HR Scored: N/A  Wide Complex Tachycardia 0 Highest HR Scored: N/A  Asystole 0 Longest Pause: N/A  Atrial Fibrillation 0 Duration Longest Event: N/A  Other Arrythmias  No Type:   Periodic Limb Movement Data: (Primary legs unless otherwise noted) Total # Limb Movement 0 Limb Movement Index 0.0  Total # PLMS    PLMS Index     Total # PLMS Arousals    PLMS Arousal Index     Percentage Sleep Time with PLMS   min (   % sleep)  Mean Duration limb movements (secs)       IPAP Level (cmH2O) EPAP Level (cmH2O) Total Duration (min) Sleep Duration (min) Sleep (%) REM (%) CA  #) OA # MA # HYP #) AHI (#/hr) RERAs # RERAs (#/hr) RDI (#/hr)  0 0 8.2 0.0 0.0 0.0 0 0 0 0 0.0 0 0.0 0.0  5 5 64.6 24.1 37.3 0.0 0 0 0 1 2.5 0 0.0 2.5  6 6  107.3 96.0 89.5 4.5 0 14 0 3 10.6 0 0.0 10.6  7 7  32.1 27.5 85.7 49.5 0 11 0 5 34.9 0 0.0 34.9  8 8  52.7 52.7 100.0 46.7 0 19 0 9 31.9 0 0.0 31.9  9 0 5.5 0.0 0.0 0.0 0 0 0 0 0.0 0 0.0 0.0  9 9 63.6 35.0 55.0 46.2 0 12 0 14 44.6 0 0.0 44.6  10 10  102.0 87.5 85.8 19.6 0 20 0 9 19.9 0 0.0 19.9

## 2020-05-21 ENCOUNTER — Other Ambulatory Visit: Payer: Self-pay | Admitting: Physician Assistant

## 2020-05-21 DIAGNOSIS — J4520 Mild intermittent asthma, uncomplicated: Secondary | ICD-10-CM

## 2020-06-03 ENCOUNTER — Encounter: Payer: Self-pay | Admitting: Nurse Practitioner

## 2020-06-03 ENCOUNTER — Ambulatory Visit: Payer: BC Managed Care – PPO | Admitting: Nurse Practitioner

## 2020-06-03 ENCOUNTER — Other Ambulatory Visit: Payer: Self-pay

## 2020-06-03 VITALS — BP 150/74 | HR 96 | Temp 98.9°F | Resp 16 | Ht 72.0 in | Wt 255.8 lb

## 2020-06-03 DIAGNOSIS — J019 Acute sinusitis, unspecified: Secondary | ICD-10-CM

## 2020-06-03 DIAGNOSIS — Z91010 Allergy to peanuts: Secondary | ICD-10-CM

## 2020-06-03 DIAGNOSIS — J301 Allergic rhinitis due to pollen: Secondary | ICD-10-CM

## 2020-06-03 DIAGNOSIS — S161XXA Strain of muscle, fascia and tendon at neck level, initial encounter: Secondary | ICD-10-CM

## 2020-06-03 DIAGNOSIS — I10 Essential (primary) hypertension: Secondary | ICD-10-CM | POA: Diagnosis not present

## 2020-06-03 DIAGNOSIS — Z9989 Dependence on other enabling machines and devices: Secondary | ICD-10-CM

## 2020-06-03 DIAGNOSIS — G4733 Obstructive sleep apnea (adult) (pediatric): Secondary | ICD-10-CM | POA: Diagnosis not present

## 2020-06-03 DIAGNOSIS — J4520 Mild intermittent asthma, uncomplicated: Secondary | ICD-10-CM

## 2020-06-03 MED ORDER — ATENOLOL 100 MG PO TABS: 100.0000 mg | ORAL_TABLET | Freq: Every day | ORAL | 3 refills | Status: DC

## 2020-06-03 MED ORDER — AMOXICILLIN-POT CLAVULANATE 875-125 MG PO TABS: 1.0000 | ORAL_TABLET | Freq: Two times a day (BID) | ORAL | 0 refills | Status: DC

## 2020-06-03 MED ORDER — EPINEPHRINE 0.3 MG/0.3ML IJ SOAJ: 0.3000 mg | INTRAMUSCULAR | 1 refills | Status: DC | PRN

## 2020-06-03 MED ORDER — ALBUTEROL SULFATE HFA 108 (90 BASE) MCG/ACT IN AERS: INHALATION_SPRAY | RESPIRATORY_TRACT | 0 refills | Status: DC

## 2020-06-03 MED ORDER — CETIRIZINE HCL 10 MG PO TABS: 10.0000 mg | ORAL_TABLET | Freq: Every day | ORAL | 11 refills | Status: DC

## 2020-06-03 MED ORDER — CHLORTHALIDONE 25 MG PO TABS: 25.0000 mg | ORAL_TABLET | Freq: Every day | ORAL | 0 refills | Status: DC

## 2020-06-03 MED ORDER — METAXALONE 800 MG PO TABS: 800.0000 mg | ORAL_TABLET | Freq: Three times a day (TID) | ORAL | 0 refills | Status: AC | PRN

## 2020-06-03 NOTE — Progress Notes (Signed)
[facility] [address] [place], [place] [address]  Internal MEDICINE  Office Visit Note  Patient Name: Miguel Kent  [id number]  [id number]  Date of Service: 06/04/2020  Chief Complaint  Patient presents with  . Follow-up  . Hypertension    HPI [name] presents for a follow up visit regarding hypertension, OSA, and refills.  Reviewed sleep study, Using cpap at home, doing better -[name] also reports having symptoms of sinus infection - x1 week, nasal congestion, sinus pain/pressure, headache, sore throat, ear fullness.  Was treated for sinus infection with amoxicillin in march 2022.     Current Medication: Outpatient Encounter Medications as of 06/03/2020  Medication Sig  . amLODipine (NORVASC) 5 MG tablet Take 1 tablet (5 mg total) by mouth daily.  [name] amoxicillin-clavulanate (AUGMENTIN) 875-125 MG tablet Take 1 tablet by mouth 2 (two) times daily for 10 days. May crush and mix in food if needed.  [name] atenolol (TENORMIN) 100 MG tablet Take 1 tablet (100 mg total) by mouth daily.  [name] azelastine (ASTELIN) 0.1 % nasal spray Place 1 spray into both nostrils 2 (two) times daily for 7 days. Use in each nostril as directed  . cetirizine (ZYRTEC) 10 MG tablet Take 1 tablet (10 mg total) by mouth daily.  . chlorthalidone (HYGROTON) 25 MG tablet Take 1 tablet (25 mg total) by mouth daily.  [name] EPINEPHrine 0.3 mg/0.3 mL IJ SOAJ injection Inject 0.3 mg into the muscle as needed for anaphylaxis.  . metaxalone (SKELAXIN) 800 MG tablet Take 1 tablet (800 mg total) by mouth 3 (three) times daily as needed for up to 14 days for muscle spasms.  . metFORMIN (GLUCOPHAGE) 850 MG tablet Take 850 mg by mouth 2 (two) times daily with a meal.  . [DISCONTINUED] albuterol (VENTOLIN HFA) 108 (90 Base) MCG/ACT inhaler INHALE 2 PUFFS BY MOUTH EVERY 6 HOURS AS NEEDED FOR WHEEZING AND FOR SHORTNESS OF BREATH  . [DISCONTINUED] atenolol-chlorthalidone (TENORETIC) 100-25 MG tablet Take 1 tablet by mouth  daily.  [name] albuterol (VENTOLIN HFA) 108 (90 Base) MCG/ACT inhaler INHALE 2 PUFFS BY MOUTH EVERY 6 HOURS AS NEEDED FOR WHEEZING AND FOR SHORTNESS OF BREATH   No facility-administered encounter medications on file as of 06/03/2020.    Surgical History: Past Surgical History:  Procedure Laterality Date  . TONSILLECTOMY    . TYMPANOSTOMY TUBE PLACEMENT      Medical History: Past Medical History:  Diagnosis Date  . Hypertension   . Pre-diabetes     Family History: Family History  Problem Relation Age of Onset  . Healthy Mother   . Hypertension Father     Social History   Socioeconomic History  . Marital status: Single    Spouse name: Not on file  . Number of children: Not on file  . Years of education: Not on file  . Highest education level: Not on file  Occupational History  . Not on file  Tobacco Use  . Smoking status: Never Smoker  . Smokeless tobacco: Never Used  Vaping Use  . Vaping Use: Never used  Substance and Sexual Activity  . Alcohol use: Never  . Drug use: Never  . Sexual activity: Not on file  Other Topics Concern  . Not on file  Social History Narrative  . Not on file   Social Determinants of Health   Financial Resource Strain: Not on file  Food Insecurity: Not on file  Transportation Needs: Not on file  Physical Activity: Not on file  Stress:  Not on file  Social Connections: Not on file  Intimate Partner Violence: Not on file      Review of Systems  Constitutional: Negative.  Negative for fatigue and fever.  HENT: Positive for congestion, postnasal drip, sinus pressure, sinus pain and sore throat. Negative for mouth sores.   Eyes: Negative.   Respiratory: Positive for chest tightness and wheezing. Negative for cough.   Cardiovascular: Negative.  Negative for chest pain.  Gastrointestinal: Negative.   Genitourinary: Negative for flank pain.  Musculoskeletal: Negative for arthralgias.  Psychiatric/Behavioral: Negative.     Vital  Signs: BP (!) 150/74   Pulse 96   Temp 98.9 F (37.2 C)   Resp 16   Ht 6' (1.829 m)   Wt 255 lb 12.8 oz (116 kg)   SpO2 99%   BMI 34.69 kg/m    Physical Exam Constitutional:      Appearance: Normal appearance.  HENT:     Head: Normocephalic and atraumatic.     Right Ear: Tympanic membrane, ear canal and external ear normal.     Left Ear: Tympanic membrane, ear canal and external ear normal.     Nose: Congestion present.     Mouth/Throat:     Mouth: Mucous membranes are dry.     Pharynx: Posterior oropharyngeal erythema present.  Neurological:     Mental Status: He is alert.        Assessment/Plan: 1. Acute non-recurrent sinusitis, unspecified location The patient was recently treated in march 2022 for sinusitis with amoxicillin and azelastine nasal spray by his ENT.  Amoxicillin-clavulanate ordered for a 10 day course. Nasal spray reordered.  - amoxicillin-clavulanate (AUGMENTIN) 875-125 MG tablet; Take 1 tablet by mouth 2 (two) times daily for 10 days. May crush and mix in food if needed.  Dispense: 20 tablet; Refill: 0 - azelastine (ASTELIN) 0.1 % nasal spray; Place 1 spray into both nostrils 2 (two) times daily for 7 days. Use in each nostril as directed  Dispense: 2.1 mL; Refill: 0  2. OSA on CPAP [name] has his sleep study done and is using his CPAP at night. He reports that it has been helping. Results of the sleep study and CPAP titration were reviewed with the patient.   3. History of peanut allergy [name] has a peanut allergy with history of anaphylactic reactions. He does not have an epi pen at this time. Epi pen prescription sent to the pharmacy.  - EPINEPHrine 0.3 mg/0.3 mL IJ SOAJ injection; Inject 0.3 mg into the muscle as needed for anaphylaxis.  Dispense: 1 each; Refill: 1  4. Strain of neck muscle, initial encounter [name] is having neck muscle pain and tightness. Muscle relaxant prescribed for 14 days. - metaxalone (SKELAXIN) 800 MG tablet; Take 1  tablet (800 mg total) by mouth 3 (three) times daily as needed for up to 14 days for muscle spasms.  Dispense: 42 tablet; Refill: 0  5. Mild intermittent asthma, unspecified whether complicated Alexy has mild intermittent asthma. Needs a refill on his rescue inhaler. - albuterol (VENTOLIN HFA) 108 (90 Base) MCG/ACT inhaler; INHALE 2 PUFFS BY MOUTH EVERY 6 HOURS AS NEEDED FOR WHEEZING AND FOR SHORTNESS OF BREATH  Dispense: 18 g; Refill: 0  6. Hay fever Sulayman has seasonal allergies and uses cetirizine for alleviate symptoms. Refill ordered.  - cetirizine (ZYRTEC) 10 MG tablet; Take 1 tablet (10 mg total) by mouth daily.  Dispense: 30 tablet; Refill: 11  7. Benign hypertension Refilled Tenormin and Chlorthalidone ( can cause  hypokalemia) will check metb )   General Counseling: Joeziah verbalizes understanding of the findings of todays visit and agrees with plan of treatment. I have discussed any further diagnostic evaluation that may be needed or ordered today. We also reviewed his medications today. he has been encouraged to call the office with any questions or concerns that should arise related to todays visit.    No orders of the defined types were placed in this encounter.   Meds ordered this encounter  Medications  . atenolol (TENORMIN) 100 MG tablet    Sig: Take 1 tablet (100 mg total) by mouth daily.    Dispense:  90 tablet    Refill:  3  . chlorthalidone (HYGROTON) 25 MG tablet    Sig: Take 1 tablet (25 mg total) by mouth daily.    Dispense:  90 tablet    Refill:  0  . amoxicillin-clavulanate (AUGMENTIN) 875-125 MG tablet    Sig: Take 1 tablet by mouth 2 (two) times daily for 10 days. May crush and mix in food if needed.    Dispense:  20 tablet    Refill:  0  . EPINEPHrine 0.3 mg/0.3 mL IJ SOAJ injection    Sig: Inject 0.3 mg into the muscle as needed for anaphylaxis.    Dispense:  1 each    Refill:  1  . metaxalone (SKELAXIN) 800 MG tablet    Sig: Take 1 tablet (800  mg total) by mouth 3 (three) times daily as needed for up to 14 days for muscle spasms.    Dispense:  42 tablet    Refill:  0  . cetirizine (ZYRTEC) 10 MG tablet    Sig: Take 1 tablet (10 mg total) by mouth daily.    Dispense:  30 tablet    Refill:  11  . albuterol (VENTOLIN HFA) 108 (90 Base) MCG/ACT inhaler    Sig: INHALE 2 PUFFS BY MOUTH EVERY 6 HOURS AS NEEDED FOR WHEEZING AND FOR SHORTNESS OF BREATH    Dispense:  18 g    Refill:  0  . azelastine (ASTELIN) 0.1 % nasal spray    Sig: Place 1 spray into both nostrils 2 (two) times daily for 7 days. Use in each nostril as directed    Dispense:  2.1 mL    Refill:  0   Return if symptoms worsen or fail to improve.   Total time spent: 30 Minutes Time spent includes review of chart, medications, test results, and follow up plan with the patient.   [place] Controlled Substance Database was reviewed by me.   Dr Lyndon Code Internal medicine

## 2020-06-04 MED ORDER — AZELASTINE HCL 0.1 % NA SOLN: 1.0000 | Freq: Two times a day (BID) | NASAL | 0 refills | Status: DC

## 2020-06-12 ENCOUNTER — Other Ambulatory Visit: Payer: Self-pay

## 2020-06-12 ENCOUNTER — Ambulatory Visit
Admission: EM | Admit: 2020-06-12 | Discharge: 2020-06-12 | Disposition: A | Discharge status: Home / Self Care | Payer: BC Managed Care – PPO | Attending: Family Medicine | Admitting: Family Medicine

## 2020-06-12 DIAGNOSIS — U071 COVID-19: Secondary | ICD-10-CM

## 2020-06-12 HISTORY — DX: Unspecified asthma, uncomplicated: J45.909

## 2020-06-12 LAB — RESP PANEL BY RT-PCR (FLU A&B, COVID) ARPGX2
Influenza A by PCR: NEGATIVE
Influenza B by PCR: NEGATIVE
SARS Coronavirus 2 by RT PCR: POSITIVE — AB

## 2020-06-12 MED ORDER — MOLNUPIRAVIR EUA 200MG CAPSULE: 4.0000 | ORAL_CAPSULE | Freq: Two times a day (BID) | ORAL | 0 refills | Status: AC

## 2020-06-12 NOTE — ED Triage Notes (Addendum)
Pt c/o nasal congestion and cough since Thursday. Pt also reports chills, diarrhea, headache and bilateral ear pain - states his ears "feel wet" inside. Pt denies f/v/d or other symptoms.

## 2020-06-12 NOTE — ED Provider Notes (Signed)
[facility]    CSN: [id number] Arrival date & time: 06/12/20  0945      History   Chief Complaint Chief Complaint  Patient presents with  . Cough  . Nasal Congestion   HPI  30 year old male presents with respiratory symptoms.  Patient has been sick since Thursday.  He reports fever, chills, cough, congestion, headache, ear pain, diarrhea.  No reported sick contacts.  He does work in the school system.  Pain 8/10 in severity.  No relieving factors.  No other reported symptoms.  No other complaints.  Past Medical History:  Diagnosis Date  . Asthma   . Hypertension   . Pre-diabetes    Past Surgical History:  Procedure Laterality Date  . TONSILLECTOMY    . TYMPANOSTOMY TUBE PLACEMENT     Home Medications    Prior to Admission medications   Medication Sig Start Date End Date Taking? Authorizing Provider  albuterol (VENTOLIN HFA) 108 (90 Base) MCG/ACT inhaler INHALE 2 PUFFS BY MOUTH EVERY 6 HOURS AS NEEDED FOR WHEEZING AND FOR SHORTNESS OF BREATH 06/03/20  Yes Abernathy, Alyssa, NP  amLODipine (NORVASC) 5 MG tablet Take 1 tablet (5 mg total) by mouth daily. 03/01/20  Yes McDonough, Lauren K, PA-C  atenolol (TENORMIN) 100 MG tablet Take 1 tablet (100 mg total) by mouth daily. 06/03/20  Yes Abernathy, Arlyss Repress, NP  chlorthalidone (HYGROTON) 25 MG tablet Take 1 tablet (25 mg total) by mouth daily. 06/03/20  Yes Abernathy, Arlyss Repress, NP  EPINEPHrine 0.3 mg/0.3 mL IJ SOAJ injection Inject 0.3 mg into the muscle as needed for anaphylaxis. 06/03/20  Yes Abernathy, Arlyss Repress, NP  Fluticasone-Salmeterol (ADVAIR) 100-50 MCG/DOSE AEPB 2 (two) times daily. 03/28/20  Yes [provider]  metaxalone (SKELAXIN) 800 MG tablet Take 1 tablet (800 mg total) by mouth 3 (three) times daily as needed for up to 14 days for muscle spasms. 06/03/20 06/17/20 Yes Abernathy, Arlyss Repress, NP  metFORMIN (GLUCOPHAGE) 850 MG tablet Take 850 mg by mouth 2 (two) times daily with a meal.   Yes [provider]  molnupiravir EUA 200 mg CAPS Take 4 capsules (800 mg total) by mouth 2 (two) times daily for 5 days. 06/12/20 06/17/20 Yes Petra Sargeant G, DO  azelastine (ASTELIN) 0.1 % nasal spray Place 1 spray into both nostrils 2 (two) times daily for 7 days. Use in each nostril as directed 06/04/20 06/11/20  Sallyanne Kuster, NP  cetirizine (ZYRTEC) 10 MG tablet Take 1 tablet (10 mg total) by mouth daily. 06/03/20   Sallyanne Kuster, NP    Family History Family History  Problem Relation Age of Onset  . Healthy Mother   . Hypertension Father     Social History Social History   Tobacco Use  . Smoking status: Never Smoker  . Smokeless tobacco: Never Used  Vaping Use  . Vaping Use: Never used  Substance Use Topics  . Alcohol use: Never  . Drug use: Never     Allergies   Peanut oil and Peanut-containing drug products   Review of Systems Review of Systems Per HPI  Physical Exam Triage Vital Signs ED Triage Vitals  Enc Vitals Group     BP 06/12/20 1003 (!) 141/112     Pulse Rate 06/12/20 1003 (!) 113     Resp 06/12/20 1003 18     Temp 06/12/20 1003 (!) 100.7 F (38.2 C)     Temp Source 06/12/20 1003 Oral     SpO2 06/12/20 1003 98 %  Weight 06/12/20 0958 255 lb (115.7 kg)     Height 06/12/20 0958 6' (1.829 m)     Head Circumference --      Peak Flow --      Pain Score 06/12/20 0957 8     Pain Loc --      Pain Edu? --      Excl. in GC? --    Updated Vital Signs BP (!) 141/112 (BP Location: Left Arm)   Pulse (!) 113   Temp (!) 100.7 F (38.2 C) (Oral)   Resp 18   Ht 6' (1.829 m)   Wt 115.7 kg   SpO2 98%   BMI 34.58 kg/m   Visual Acuity Right Eye Distance:   Left Eye Distance:   Bilateral Distance:    Right Eye Near:   Left Eye Near:    Bilateral Near:     Physical Exam Vitals and nursing note reviewed.  Constitutional:      General: He is not in acute distress.    Appearance: He is obese.  HENT:     Head: Normocephalic and atraumatic.     Nose:  Congestion present.     Mouth/Throat:     Pharynx: Oropharynx is clear.  Cardiovascular:     Rate and Rhythm: Regular rhythm. Tachycardia present.  Pulmonary:     Effort: Pulmonary effort is normal.     Breath sounds: Normal breath sounds. No wheezing, rhonchi or rales.  Neurological:     Mental Status: He is alert.  Psychiatric:        Mood and Affect: Mood normal.        Behavior: Behavior normal.      UC Treatments / Results  Labs (all labs ordered are listed, but only abnormal results are displayed) Labs Reviewed  RESP PANEL BY RT-PCR (FLU A&B, COVID) ARPGX2 - Abnormal; Notable for the following components:      Result Value   SARS Coronavirus 2 by RT PCR POSITIVE (*)    All other components within normal limits    EKG   Radiology No results found.  Procedures Procedures (including critical care time)  Medications Ordered in UC Medications - No data to display  Initial Impression / Assessment and Plan / UC Course  I have reviewed the triage vital signs and the nursing notes.  Pertinent labs & imaging results that were available during my care of the patient were reviewed by me and considered in my medical decision making (see chart for details).    30 year old male presents with COVID-19.  This is an acute illness with systemic symptoms.  After discussion of therapeutic options as he is high risk, he elected to proceed with molnupiravir.  Rx sent to the pharmacy.  Final Clinical Impressions(s) / UC Diagnoses   Final diagnoses:  COVID     Discharge Instructions     Rest. Fluids.  Stay home.  Medication as prescribed.  Take care  Dr. Adriana Simas    ED Prescriptions    Medication Sig Dispense Auth. Provider   molnupiravir EUA 200 mg CAPS Take 4 capsules (800 mg total) by mouth 2 (two) times daily for 5 days. 40 capsule Tommie Sams, DO     PDMP not reviewed this encounter.   Tommie Sams, [place] 06/12/20 1131

## 2020-06-12 NOTE — Discharge Instructions (Addendum)
Rest. Fluids.  Stay home.  Medication as prescribed.  Take care  Dr. Adriana Simas

## 2020-06-14 ENCOUNTER — Telehealth: Payer: Self-pay

## 2020-06-14 NOTE — Telephone Encounter (Signed)
LMOM for pt to call us back to let us know how he is feeling per DFK.

## 2020-06-14 NOTE — Telephone Encounter (Signed)
-----   Message from Fozia M Khan, MD sent at 06/12/2020  6:25 PM EDT ----- He tested positive for covid, please call on Monday to see how is he doing   

## 2020-06-16 ENCOUNTER — Telehealth: Payer: Self-pay

## 2020-06-16 NOTE — Telephone Encounter (Signed)
Spoke to pt and he states he is feeling a little better but is not symptom free yet.  He asked if we could give him a dr note to extend going to work a little longer.  I advised for him to call the urgent care back and see if they will do the note and if not to call us back.

## 2020-06-16 NOTE — Telephone Encounter (Signed)
-----   Message from Lyndon Code, MD sent at 06/12/2020  6:25 PM EDT ----- He tested positive for covid, please call on Monday to see how is he doing

## 2020-07-16 ENCOUNTER — Ambulatory Visit: Payer: BC Managed Care – PPO | Admitting: Nurse Practitioner

## 2020-07-16 VITALS — Ht 72.0 in | Wt 255.0 lb

## 2020-07-16 DIAGNOSIS — R42 Dizziness and giddiness: Secondary | ICD-10-CM

## 2020-07-16 DIAGNOSIS — J4520 Mild intermittent asthma, uncomplicated: Secondary | ICD-10-CM | POA: Diagnosis not present

## 2020-07-16 DIAGNOSIS — J0141 Acute recurrent pansinusitis: Secondary | ICD-10-CM | POA: Diagnosis not present

## 2020-07-16 DIAGNOSIS — L309 Dermatitis, unspecified: Secondary | ICD-10-CM

## 2020-07-16 MED ORDER — ALBUTEROL SULFATE HFA 108 (90 BASE) MCG/ACT IN AERS: INHALATION_SPRAY | RESPIRATORY_TRACT | 0 refills | Status: DC

## 2020-07-16 MED ORDER — GUAIFENESIN 200 MG/5ML PO LIQD: 5.0000 mL | Freq: Four times a day (QID) | ORAL | 0 refills | Status: DC | PRN

## 2020-07-16 MED ORDER — MECLIZINE HCL 25 MG PO TABS: 25.0000 mg | ORAL_TABLET | Freq: Two times a day (BID) | ORAL | 0 refills | Status: DC | PRN

## 2020-07-16 MED ORDER — DOXYCYCLINE HYCLATE 100 MG PO CAPS
100.0000 mg | ORAL_CAPSULE | Freq: Two times a day (BID) | ORAL | 0 refills | Status: DC
Start: 2020-07-16 — End: 2020-10-03

## 2020-07-16 MED ORDER — TRIAMCINOLONE ACETONIDE 0.5 % EX OINT: TOPICAL_OINTMENT | Freq: Two times a day (BID) | CUTANEOUS | 2 refills | Status: DC

## 2020-07-16 NOTE — Progress Notes (Signed)
[facility] [address] [place], [place] [address]  Internal MEDICINE  Telephone Visit  Patient Name: Miguel Kent  [id number]  [id number]  Date of Service: 07/16/2020  I connected with the patient at 12:07 PM by telephone and verified the patients identity using two identifiers.   I discussed the limitations, risks, security and privacy concerns of performing an evaluation and management service by telephone and the availability of in person appointments. I also discussed with the patient that there may be a patient responsible charge related to the service.  The patient expressed understanding and agrees to proceed.    Chief Complaint  Patient presents with   Telephone Assessment   Telephone Screen    [phone] virtual   Acute Visit    Congestion sinus pressure     HPI Miguel Kent present via virtual video visit for nasal congestion and sinus pressure. He recently had a COVID infection and was given molnupiravir for that. Prior to contracting COVID, he had a course of Augmentin for a sinus infection in May. He is currently experiencing post nasal drip, runny nose, sinus pain, sinus pressure, ear pain, and nasal congestion. This has been going on since he had COVID a few weeks ago.  He also has headaches and dizziness intermittently, this problem has been happening for about a year now. He is requesting CT scan of his head.  -also requesting refills of a couple of his medications.    Current Medication: Outpatient Encounter Medications as of 07/16/2020  Medication Sig   amLODipine (NORVASC) 5 MG tablet Take 1 tablet (5 mg total) by mouth daily.   atenolol (TENORMIN) 100 MG tablet Take 1 tablet (100 mg total) by mouth daily.   cetirizine (ZYRTEC) 10 MG tablet Take 1 tablet (10 mg total) by mouth daily.   chlorthalidone (HYGROTON) 25 MG tablet Take 1 tablet (25 mg total) by mouth daily.   doxycycline (VIBRAMYCIN) 100 MG capsule Take 1 capsule (100 mg total) by mouth 2  (two) times daily.   EPINEPHrine 0.3 mg/0.3 mL IJ SOAJ injection Inject 0.3 mg into the muscle as needed for anaphylaxis.   Fluticasone-Salmeterol (ADVAIR) 100-50 MCG/DOSE AEPB 2 (two) times daily.   Guaifenesin 200 MG/5ML LIQD Take 5 mLs (200 mg total) by mouth every 6 (six) hours as needed (cough and mucous relief).   meclizine (ANTIVERT) 25 MG tablet Take 1-2 tablets (25-50 mg total) by mouth 2 (two) times daily as needed for dizziness.   metFORMIN (GLUCOPHAGE) 850 MG tablet Take 850 mg by mouth 2 (two) times daily with a meal.   [DISCONTINUED] albuterol (VENTOLIN HFA) 108 (90 Base) MCG/ACT inhaler INHALE 2 PUFFS BY MOUTH EVERY 6 HOURS AS NEEDED FOR WHEEZING AND FOR SHORTNESS OF BREATH   albuterol (VENTOLIN HFA) 108 (90 Base) MCG/ACT inhaler INHALE 2 PUFFS BY MOUTH EVERY 6 HOURS AS NEEDED FOR WHEEZING AND FOR SHORTNESS OF BREATH   azelastine (ASTELIN) 0.1 % nasal spray Place 1 spray into both nostrils 2 (two) times daily for 7 days. Use in each nostril as directed   triamcinolone ointment (KENALOG) 0.5 % Apply topically 2 (two) times daily.   [DISCONTINUED] triamcinolone ointment (KENALOG) 0.5 % Apply topically 2 (two) times daily.   No facility-administered encounter medications on file as of 07/16/2020.    Surgical History: Past Surgical History:  Procedure Laterality Date   TONSILLECTOMY     TYMPANOSTOMY TUBE PLACEMENT      Medical History: Past Medical History:  Diagnosis Date   Asthma  Hypertension    Pre-diabetes     Family History: Family History  Problem Relation Age of Onset   Healthy Mother    Hypertension Father     Social History   Socioeconomic History   Marital status: Single    Spouse name: Not on file   Number of children: Not on file   Years of education: Not on file   Highest education level: Not on file  Occupational History   Not on file  Tobacco Use   Smoking status: Never   Smokeless tobacco: Never  Vaping Use   Vaping Use: Never used   Substance and Sexual Activity   Alcohol use: Never   Drug use: Never   Sexual activity: Not on file  Other Topics Concern   Not on file  Social History Narrative   Not on file   Social Determinants of Health   Financial Resource Strain: Not on file  Food Insecurity: Not on file  Transportation Needs: Not on file  Physical Activity: Not on file  Stress: Not on file  Social Connections: Not on file  Intimate Partner Violence: Not on file      Review of Systems  Constitutional:  Negative for chills, fatigue and fever.  HENT:  Positive for congestion, ear pain, postnasal drip, rhinorrhea, sinus pressure and sinus pain.   Respiratory:  Negative for cough, shortness of breath and wheezing.   Musculoskeletal:  Positive for myalgias and neck pain.  Neurological:  Positive for headaches.   Vital Signs: Ht 6' (1.829 m)   Wt 255 lb (115.7 kg)   BMI 34.58 kg/m    Observation/Objective: Theseus is alert and oriented over telephone visit. He sounds congested when talking. Miguel Kent does not sound short of breath and hedoes not sounds like he is in any acute distress at this time.    Assessment/Plan: 1. Acute recurrent pansinusitis Garnie has already had a course of augmentin in May, doxycycline prescribed and well as medication for symptomatic relief of cough/congestion. - doxycycline (VIBRAMYCIN) 100 MG capsule; Take 1 capsule (100 mg total) by mouth 2 (two) times daily.  Dispense: 14 capsule; Refill: 0 - Guaifenesin 200 MG/5ML LIQD; Take 5 mLs (200 mg total) by mouth every 6 (six) hours as needed (cough and mucous relief).  Dispense: 118 mL; Refill: 0  2. Dizziness Meclizine prescribed for symptomatic relief, CT head ordered. - CT Head Wo Contrast; Future - meclizine (ANTIVERT) 25 MG tablet; Take 1-2 tablets (25-50 mg total) by mouth 2 (two) times daily as needed for dizziness.  Dispense: 30 tablet; Refill: 0  3. Mild intermittent asthma, unspecified whether complicated Stable,  albuterol inhaler reordered. - albuterol (VENTOLIN HFA) 108 (90 Base) MCG/ACT inhaler; INHALE 2 PUFFS BY MOUTH EVERY 6 HOURS AS NEEDED FOR WHEEZING AND FOR SHORTNESS OF BREATH  Dispense: 18 g; Refill: 0  4. Eczema, unspecified type History of sporadic patches of eczema per patient, triamcinolone prescribed per patient request.  - triamcinolone ointment (KENALOG) 0.5 %; Apply topically 2 (two) times daily.  Dispense: 30 g; Refill: 2   General Counseling: Amalio verbalizes understanding of the findings of today's phone visit and agrees with plan of treatment. I have discussed any further diagnostic evaluation that may be needed or ordered today. We also reviewed his medications today. he has been encouraged to call the office with any questions or concerns that should arise related to todays visit.    Orders Placed This Encounter  Procedures   CT Head Wo Contrast  Meds ordered this encounter  Medications   doxycycline (VIBRAMYCIN) 100 MG capsule    Sig: Take 1 capsule (100 mg total) by mouth 2 (two) times daily.    Dispense:  14 capsule    Refill:  0   meclizine (ANTIVERT) 25 MG tablet    Sig: Take 1-2 tablets (25-50 mg total) by mouth 2 (two) times daily as needed for dizziness.    Dispense:  30 tablet    Refill:  0   Guaifenesin 200 MG/5ML LIQD    Sig: Take 5 mLs (200 mg total) by mouth every 6 (six) hours as needed (cough and mucous relief).    Dispense:  118 mL    Refill:  0   albuterol (VENTOLIN HFA) 108 (90 Base) MCG/ACT inhaler    Sig: INHALE 2 PUFFS BY MOUTH EVERY 6 HOURS AS NEEDED FOR WHEEZING AND FOR SHORTNESS OF BREATH    Dispense:  18 g    Refill:  0   triamcinolone ointment (KENALOG) 0.5 %    Sig: Apply topically 2 (two) times daily.    Dispense:  30 g    Refill:  2   Return if symptoms worsen or fail to improve.   Time spent:20 Minutes  This patient was seen by Sallyanne Kuster, FNP-C in collaboration with Dr. Beverely Risen as a part of collaborative care  agreement.   Rossi Silvestro R. Tedd Sias, MSN, FNP-C Internal medicine

## 2020-07-21 ENCOUNTER — Emergency Department: Payer: BC Managed Care – PPO

## 2020-07-21 ENCOUNTER — Encounter: Payer: Self-pay | Admitting: Emergency Medicine

## 2020-07-21 ENCOUNTER — Other Ambulatory Visit: Payer: Self-pay

## 2020-07-21 ENCOUNTER — Emergency Department
Admission: EM | Admit: 2020-07-21 | Discharge: 2020-07-21 | Disposition: A | Discharge status: Home / Self Care | Payer: BC Managed Care – PPO | Attending: Emergency Medicine | Admitting: Emergency Medicine

## 2020-07-21 DIAGNOSIS — I10 Essential (primary) hypertension: Secondary | ICD-10-CM | POA: Insufficient documentation

## 2020-07-21 DIAGNOSIS — H6983 Other specified disorders of Eustachian tube, bilateral: Secondary | ICD-10-CM

## 2020-07-21 DIAGNOSIS — R42 Dizziness and giddiness: Secondary | ICD-10-CM | POA: Insufficient documentation

## 2020-07-21 DIAGNOSIS — Z91010 Allergy to peanuts: Secondary | ICD-10-CM | POA: Diagnosis not present

## 2020-07-21 DIAGNOSIS — R519 Headache, unspecified: Secondary | ICD-10-CM | POA: Diagnosis not present

## 2020-07-21 DIAGNOSIS — J45909 Unspecified asthma, uncomplicated: Secondary | ICD-10-CM | POA: Insufficient documentation

## 2020-07-21 DIAGNOSIS — Z7951 Long term (current) use of inhaled steroids: Secondary | ICD-10-CM | POA: Diagnosis not present

## 2020-07-21 DIAGNOSIS — R0981 Nasal congestion: Secondary | ICD-10-CM | POA: Insufficient documentation

## 2020-07-21 DIAGNOSIS — J329 Chronic sinusitis, unspecified: Secondary | ICD-10-CM

## 2020-07-21 DIAGNOSIS — Z79899 Other long term (current) drug therapy: Secondary | ICD-10-CM | POA: Diagnosis not present

## 2020-07-21 NOTE — ED Notes (Signed)
Resting with NAD.

## 2020-07-21 NOTE — ED Notes (Addendum)
Pt states top of head pain, sinus pain and some vertigo this am. Pt states he did take his Amlodipine this am. Denies covid exposure. Placed in bed, call light in reach, bed locked and low.

## 2020-07-21 NOTE — ED Provider Notes (Signed)
[facility] Emergency Department Provider Note   ____________________________________________   Event Date/Time   First MD Initiated Contact with Patient 07/21/20 [phone]     (approximate)  I have reviewed the triage vital signs and the nursing notes.   HISTORY  Chief Complaint Headache    HPI Miguel Kent is a 30 y.o. male who presents for frontal headaches the past year  LOCATION: States entire front of his face that is worse behind his eyes DURATION: Intermittent over the last year TIMING: Comes and goes but is worse in the morning SEVERITY: 8/10 QUALITY: Pressure/throbbing CONTEXT: Patient states he has had sinus problems for a long time and has recently been seen by an ENT but was unable to get follow-up imaging due to lack of insurance MODIFYING FACTORS: Decongestants improves his pain for short period of time before the pain returns ASSOCIATED SYMPTOMS: Lightheadedness, ear fullness   Per medical record review history of sinus issues including eustachian tube dysfunction and tympanostomy tube placement as a child          Past Medical History:  Diagnosis Date   Asthma    Hypertension    Pre-diabetes     There are no problems to display for this patient.   Past Surgical History:  Procedure Laterality Date   TONSILLECTOMY     TYMPANOSTOMY TUBE PLACEMENT      Prior to Admission medications   Medication Sig Start Date End Date Taking? Authorizing Provider  albuterol (VENTOLIN HFA) 108 (90 Base) MCG/ACT inhaler INHALE 2 PUFFS BY MOUTH EVERY 6 HOURS AS NEEDED FOR WHEEZING AND FOR SHORTNESS OF BREATH 07/16/20   Sallyanne Kuster, NP  amLODipine (NORVASC) 5 MG tablet Take 1 tablet (5 mg total) by mouth daily. 03/01/20   McDonough, Salomon Fick, PA-C  atenolol (TENORMIN) 100 MG tablet Take 1 tablet (100 mg total) by mouth daily. 06/03/20   Sallyanne Kuster, NP  azelastine (ASTELIN) 0.1 % nasal spray Place 1 spray into both nostrils 2 (two)  times daily for 7 days. Use in each nostril as directed 06/04/20 06/11/20  Sallyanne Kuster, NP  cetirizine (ZYRTEC) 10 MG tablet Take 1 tablet (10 mg total) by mouth daily. 06/03/20   Sallyanne Kuster, NP  chlorthalidone (HYGROTON) 25 MG tablet Take 1 tablet (25 mg total) by mouth daily. 06/03/20   Sallyanne Kuster, NP  doxycycline (VIBRAMYCIN) 100 MG capsule Take 1 capsule (100 mg total) by mouth 2 (two) times daily. 07/16/20   Sallyanne Kuster, NP  EPINEPHrine 0.3 mg/0.3 mL IJ SOAJ injection Inject 0.3 mg into the muscle as needed for anaphylaxis. 06/03/20   Sallyanne Kuster, NP  Fluticasone-Salmeterol (ADVAIR) 100-50 MCG/DOSE AEPB 2 (two) times daily. 03/28/20   [provider]  Guaifenesin 200 MG/5ML LIQD Take 5 mLs (200 mg total) by mouth every 6 (six) hours as needed (cough and mucous relief). 07/16/20   Sallyanne Kuster, NP  meclizine (ANTIVERT) 25 MG tablet Take 1-2 tablets (25-50 mg total) by mouth 2 (two) times daily as needed for dizziness. 07/16/20   Sallyanne Kuster, NP  metFORMIN (GLUCOPHAGE) 850 MG tablet Take 850 mg by mouth 2 (two) times daily with a meal.    [provider]  triamcinolone ointment (KENALOG) 0.5 % Apply topically 2 (two) times daily. 07/16/20   Sallyanne Kuster, NP    Allergies Peanut oil and Peanut-containing drug products  Family History  Problem Relation Age of Onset   Healthy Mother    Hypertension Father     Social  History Social History   Tobacco Use   Smoking status: Never   Smokeless tobacco: Never  Vaping Use   Vaping Use: Never used  Substance Use Topics   Alcohol use: Never   Drug use: Never    Review of Systems Constitutional: No fever/chills Eyes: No visual changes. ENT: Sinus pressure.  Ear fullness.  Lightheadedness.  No sore throat. Cardiovascular: Denies chest pain. Respiratory: Denies shortness of breath. Gastrointestinal: No abdominal pain.  No nausea, no vomiting.  No diarrhea. Genitourinary: Negative for  dysuria. Musculoskeletal: Negative for acute arthralgias Skin: Negative for rash. Neurological: Negative for headaches, weakness/numbness/paresthesias in any extremity Psychiatric: Negative for suicidal ideation/homicidal ideation   ____________________________________________   PHYSICAL EXAM:  VITAL SIGNS: ED Triage Vitals  Enc Vitals Group     BP 07/21/20 0413 (!) 193/117     Pulse Rate 07/21/20 0413 74     Resp 07/21/20 0413 18     Temp 07/21/20 0413 98.1 F (36.7 C)     Temp Source 07/21/20 0413 Oral     SpO2 07/21/20 0413 100 %     Weight 07/21/20 0410 252 lb (114.3 kg)     Height 07/21/20 0410 6' (1.829 m)     Head Circumference --      Peak Flow --      Pain Score 07/21/20 0410 8     Pain Loc --      Pain Edu? --      Excl. in GC? --    Constitutional: Alert and oriented. Well appearing and in no acute distress. Eyes: Conjunctivae are normal. PERRL. Head: Atraumatic. Nose/ears: Bilateral TMs pearly without bulging and structures well appreciated.  Cerumen impaction bilaterally.  No congestion/rhinnorhea. Mouth/Throat: Mucous membranes are moist. Neck: No stridor Cardiovascular: Grossly normal heart sounds.  Good peripheral circulation. Respiratory: Normal respiratory effort.  No retractions. Gastrointestinal: Soft and nontender. No distention. Musculoskeletal: No obvious deformities Neurologic:  Normal speech and language. No gross focal neurologic deficits are appreciated. Skin:  Skin is warm and dry. No rash noted. Psychiatric: Mood and affect are normal. Speech and behavior are normal.  ____________________________________________   LABS (all labs ordered are listed, but only abnormal results are displayed)  Labs Reviewed - No data to display  RADIOLOGY  ED MD interpretation: CT of the maxillofacial structures without contrast show intact facial bones without any acute osseous findings or fracture and patent paranasal sinuses  Official radiology  report(s): CT Maxillofacial Wo Contrast  Result Date: 07/21/2020 CLINICAL DATA:  Sinus pressure, headache, vertigo EXAM: CT MAXILLOFACIAL WITHOUT CONTRAST TECHNIQUE: Multidetector CT imaging of the maxillofacial structures was performed. Multiplanar CT image reconstructions were also generated. COMPARISON:  None. FINDINGS: Osseous: No fracture or mandibular dislocation. No destructive process. Orbits: Negative. No traumatic or inflammatory finding. Sinuses: Clear. Soft tissues: Negative. Limited intracranial: No significant or unexpected finding. IMPRESSION: Intact facial bones. No acute osseous finding or fracture. Patent paranasal sinuses. No significant acute or chronic sinus disease. Electronically Signed   By: Judie Petit.  Shick M.D.   On: 07/21/2020 09:06    ____________________________________________   PROCEDURES  Procedure(s) performed (including Critical Care):  Procedures   ____________________________________________   INITIAL IMPRESSION / ASSESSMENT AND PLAN / ED COURSE  As part of my medical decision making, I reviewed the following data within the electronic medical record, if available:  Nursing notes reviewed and incorporated, Labs reviewed, EKG interpreted, Old chart reviewed, Radiograph reviewed and Notes from prior ED visits reviewed and incorporated  30 year old male presents with frontal and facial headache.  No focal neurological symptoms. Neuro exam is benign. Pt is nontoxic. VSS.  Based on history and normal neurological exam I have low suspicion for intracranial tumor, intracranial bleed, meningitis, temporal arteritis, glaucoma, CO poisoning.  Most likely patient has benign headache, recommend rest, hydration, and ibuprofen.  As well as follow-up with ENT  Disposition: Discharge home with strict return precautions and instructions for prompt primary care follow up.      ____________________________________________   FINAL CLINICAL IMPRESSION(S) / ED  DIAGNOSES  Final diagnoses:  Chronic congestion of paranasal sinus  Dysfunction of both eustachian tubes     ED Discharge Orders     None        Note:  This document was prepared using Dragon voice recognition software and may include unintentional dictation errors.    Merwyn Katos, MD 07/21/20 [phone]

## 2020-07-21 NOTE — ED Triage Notes (Signed)
Patient ambulatory to triage with steady gait, without difficulty or distress noted pt reports since yesterday having "pressure to back of nose and tightness in head" that increases when lying down; st hx of sinus problems

## 2020-08-09 ENCOUNTER — Other Ambulatory Visit: Payer: Self-pay

## 2020-08-09 ENCOUNTER — Ambulatory Visit
Admission: RE | Admit: 2020-08-09 | Discharge: 2020-08-09 | Disposition: A | Discharge status: Home / Self Care | Payer: BC Managed Care – PPO | Source: Ambulatory Visit | Attending: Nurse Practitioner | Admitting: Nurse Practitioner

## 2020-08-09 DIAGNOSIS — R42 Dizziness and giddiness: Secondary | ICD-10-CM | POA: Diagnosis not present

## 2020-08-18 NOTE — Progress Notes (Signed)
CT scan of the head reviewed. Negative for any acute or non-acute abnormalities that could contribute to dizziness.

## 2020-08-19 ENCOUNTER — Telehealth: Payer: Self-pay

## 2020-08-20 NOTE — Telephone Encounter (Signed)
Pt notified result and advised him to make appt with alyssa and discuss in detailed at visit

## 2020-08-23 ENCOUNTER — Other Ambulatory Visit: Payer: Self-pay | Admitting: Nurse Practitioner

## 2020-08-23 DIAGNOSIS — J019 Acute sinusitis, unspecified: Secondary | ICD-10-CM

## 2020-08-24 ENCOUNTER — Other Ambulatory Visit: Payer: Self-pay | Admitting: Nurse Practitioner

## 2020-08-24 DIAGNOSIS — J4520 Mild intermittent asthma, uncomplicated: Secondary | ICD-10-CM

## 2020-10-03 ENCOUNTER — Other Ambulatory Visit: Payer: Self-pay

## 2020-10-03 ENCOUNTER — Ambulatory Visit
Admission: RE | Admit: 2020-10-03 | Discharge: 2020-10-03 | Disposition: A | Discharge status: Home / Self Care | Payer: BC Managed Care – PPO | Source: Ambulatory Visit | Attending: Family Medicine | Admitting: Family Medicine

## 2020-10-03 VITALS — BP 156/115 | HR 93 | Temp 99.0°F | Resp 18

## 2020-10-03 DIAGNOSIS — R6883 Chills (without fever): Secondary | ICD-10-CM | POA: Diagnosis not present

## 2020-10-03 DIAGNOSIS — Z20822 Contact with and (suspected) exposure to covid-19: Secondary | ICD-10-CM | POA: Insufficient documentation

## 2020-10-03 DIAGNOSIS — Z79899 Other long term (current) drug therapy: Secondary | ICD-10-CM | POA: Diagnosis not present

## 2020-10-03 DIAGNOSIS — J988 Other specified respiratory disorders: Secondary | ICD-10-CM | POA: Insufficient documentation

## 2020-10-03 DIAGNOSIS — R059 Cough, unspecified: Secondary | ICD-10-CM | POA: Diagnosis present

## 2020-10-03 MED ORDER — DOXYCYCLINE HYCLATE 100 MG PO CAPS: 100.0000 mg | ORAL_CAPSULE | Freq: Two times a day (BID) | ORAL | 0 refills | Status: DC

## 2020-10-03 NOTE — ED Provider Notes (Signed)
[facility]    CSN: [id number] Arrival date & time: 10/03/20  1344      History   Chief Complaint Chief Complaint  Patient presents with  . Cough  . Nasal Congestion    HPI 30 year old male presents with the above complaints.  Patient reports ongoing congestion.  Has now developed cough with a "rattling".  Also reports chills.  He is followed by ENT.  Cough and chills started approximate 4 days ago.  No documented fever.  No relieving factors.  He states that his cough is productive of green sputum.   Past Medical History:  Diagnosis Date  . Asthma   . Hypertension   . Pre-diabetes    Past Surgical History:  Procedure Laterality Date  . TONSILLECTOMY    . TYMPANOSTOMY TUBE PLACEMENT      Home Medications    Prior to Admission medications   Medication Sig Start Date End Date Taking? Authorizing Provider  albuterol (VENTOLIN HFA) 108 (90 Base) MCG/ACT inhaler INHALE 2 PUFFS BY MOUTH EVERY 6 HOURS AS NEEDED FOR WHEEZING AND FOR SHORTNESS OF BREATH 08/24/20  Yes Abernathy, Alyssa, NP  amLODipine (NORVASC) 5 MG tablet Take 1 tablet (5 mg total) by mouth daily. 03/01/20  Yes McDonough, Lauren K, PA-C  atenolol (TENORMIN) 100 MG tablet Take 1 tablet (100 mg total) by mouth daily. 06/03/20  Yes Abernathy, Alyssa, NP  Azelastine HCl 137 MCG/SPRAY SOLN USE 1 SPRAY(S) IN EACH NOSTRIL TWICE DAILY AS DIRECTED FOR 7 DAYS 08/23/20  Yes Abernathy, Alyssa, NP  doxycycline (VIBRAMYCIN) 100 MG capsule Take 1 capsule (100 mg total) by mouth 2 (two) times daily. 10/03/20  Yes Laquitha Heslin G, DO  Fluticasone-Salmeterol (ADVAIR) 100-50 MCG/DOSE AEPB 2 (two) times daily. 03/28/20  Yes [provider]  chlorthalidone (HYGROTON) 25 MG tablet Take 1 tablet (25 mg total) by mouth daily. 06/03/20   Sallyanne Kuster, NP  EPINEPHrine 0.3 mg/0.3 mL IJ SOAJ injection Inject 0.3 mg into the muscle as needed for anaphylaxis. 06/03/20   Sallyanne Kuster, NP  meclizine (ANTIVERT) 25 MG tablet Take  1-2 tablets (25-50 mg total) by mouth 2 (two) times daily as needed for dizziness. 07/16/20   Sallyanne Kuster, NP    Family History Family History  Problem Relation Age of Onset  . Healthy Mother   . Hypertension Father     Social History Social History   Tobacco Use  . Smoking status: Never  . Smokeless tobacco: Never  Vaping Use  . Vaping Use: Never used  Substance Use Topics  . Alcohol use: Never  . Drug use: Never     Allergies   Peanut oil and Peanut-containing drug products   Review of Systems Review of Systems Per HPI  Physical Exam Triage Vital Signs ED Triage Vitals [10/03/20 1406]  Enc Vitals Group     BP (!) 156/115     Pulse Rate 93     Resp 18     Temp 99 F (37.2 C)     Temp Source Oral     SpO2 96 %     Weight      Height      Head Circumference      Peak Flow      Pain Score 0     Pain Loc      Pain Edu?      Excl. in GC?    Updated Vital Signs BP (!) 156/115 (BP Location: Left Arm)   Pulse 93  Temp 99 F (37.2 C) (Oral)   Resp 18   SpO2 96%   Visual Acuity Right Eye Distance:   Left Eye Distance:   Bilateral Distance:    Right Eye Near:   Left Eye Near:    Bilateral Near:     Physical Exam Constitutional:      General: He is not in acute distress.    Appearance: Normal appearance. He is not ill-appearing.  HENT:     Head: Normocephalic and atraumatic.     Right Ear: Tympanic membrane normal.     Left Ear: Tympanic membrane normal.     Nose: Congestion present.     Mouth/Throat:     Pharynx: Oropharynx is clear.  Eyes:     General:        Right eye: No discharge.        Left eye: No discharge.     Conjunctiva/sclera: Conjunctivae normal.  Cardiovascular:     Rate and Rhythm: Normal rate and regular rhythm.  Pulmonary:     Effort: Pulmonary effort is normal. No respiratory distress.     Breath sounds: No wheezing, rhonchi or rales.  Neurological:     Mental Status: He is alert.     UC Treatments / Results   Labs (all labs ordered are listed, but only abnormal results are displayed) Labs Reviewed  SARS CORONAVIRUS 2 (TAT 6-24 HRS)    EKG   Radiology No results found.  Procedures Procedures (including critical care time)  Medications Ordered in UC Medications - No data to display  Initial Impression / Assessment and Plan / UC Course  I have reviewed the triage vital signs and the nursing notes.  Pertinent labs & imaging results that were available during my care of the patient were reviewed by me and considered in my medical decision making (see chart for details).    30 year old male presents with respiratory infection.  Patient has ongoing issues with nasal congestion and obstruction.  Now having lower tract symptoms as well.  Placing on doxycycline.  Follow-up with ENT.  Final Clinical Impressions(s) / UC Diagnoses   Final diagnoses:  Respiratory infection     Discharge Instructions      Continue your home medications.  Medication as prescribed.  Follow up with ENT if persists.  Take care  Dr. Adriana Simas    ED Prescriptions     Medication Sig Dispense Auth. Provider   doxycycline (VIBRAMYCIN) 100 MG capsule Take 1 capsule (100 mg total) by mouth 2 (two) times daily. 14 capsule Everlene Other G, DO      PDMP not reviewed this encounter.   Tommie Sams, [place] 10/03/20 1521

## 2020-10-03 NOTE — Discharge Instructions (Signed)
Continue your home medications.  Medication as prescribed.  Follow up with ENT if persists.  Take care  Dr. Adriana Simas

## 2020-10-03 NOTE — ED Triage Notes (Signed)
Pt c/o cough, nasal congestion, wheezing and chills. Started about 4 days ago. Denies fever.

## 2020-10-04 LAB — SARS CORONAVIRUS 2 (TAT 6-24 HRS): SARS Coronavirus 2: NEGATIVE

## 2020-10-05 ENCOUNTER — Telehealth: Payer: Self-pay | Admitting: Family Medicine

## 2020-10-05 MED ORDER — PREDNISONE 50 MG PO TABS: ORAL_TABLET | ORAL | 0 refills | Status: DC

## 2020-10-05 MED ORDER — AMOXICILLIN-POT CLAVULANATE 875-125 MG PO TABS: 1.0000 | ORAL_TABLET | Freq: Two times a day (BID) | ORAL | 0 refills | Status: DC

## 2020-10-05 NOTE — Telephone Encounter (Signed)
Adverse effect to doxy. Sending in Augmentin and prednisone.  Everlene Other DO [facility]

## 2020-10-11 ENCOUNTER — Other Ambulatory Visit: Payer: Self-pay

## 2020-10-11 ENCOUNTER — Encounter: Payer: Self-pay | Admitting: Nurse Practitioner

## 2020-10-11 ENCOUNTER — Ambulatory Visit: Payer: BC Managed Care – PPO | Admitting: Nurse Practitioner

## 2020-10-11 VITALS — BP 125/74 | HR 82 | Temp 98.6°F | Ht 72.0 in | Wt 258.2 lb

## 2020-10-11 DIAGNOSIS — Z7689 Persons encountering health services in other specified circumstances: Secondary | ICD-10-CM | POA: Diagnosis not present

## 2020-10-11 DIAGNOSIS — Z0000 Encounter for general adult medical examination without abnormal findings: Secondary | ICD-10-CM

## 2020-10-11 DIAGNOSIS — J4520 Mild intermittent asthma, uncomplicated: Secondary | ICD-10-CM | POA: Diagnosis not present

## 2020-10-11 DIAGNOSIS — G4733 Obstructive sleep apnea (adult) (pediatric): Secondary | ICD-10-CM | POA: Diagnosis not present

## 2020-10-11 DIAGNOSIS — J0141 Acute recurrent pansinusitis: Secondary | ICD-10-CM

## 2020-10-11 MED ORDER — AMOXICILLIN 400 MG/5ML PO SUSR: 800.0000 mg | Freq: Two times a day (BID) | ORAL | 0 refills | Status: DC

## 2020-10-11 MED ORDER — ALBUTEROL SULFATE (2.5 MG/3ML) 0.083% IN NEBU: 2.5000 mg | INHALATION_SOLUTION | Freq: Four times a day (QID) | RESPIRATORY_TRACT | 12 refills | Status: DC | PRN

## 2020-10-11 MED ORDER — PREDNISONE 10 MG (21) PO TBPK: ORAL_TABLET | ORAL | 0 refills | Status: DC

## 2020-10-11 MED ORDER — ALBUTEROL SULFATE HFA 108 (90 BASE) MCG/ACT IN AERS: INHALATION_SPRAY | RESPIRATORY_TRACT | 3 refills | Status: DC

## 2020-10-11 NOTE — Progress Notes (Signed)
New Patient Office Visit  Subjective:  Patient ID: Miguel Kent, male    DOB: 1991-01-17  Age: 30 y.o. MRN: [id number]  CC:  Chief Complaint  Patient presents with   New Patient (Initial Visit)    HPI Miguel Kent presents to establish new primary care provider.  He has a history of high blood pressure.  He has also been diagnosed with severe sleep apnea.  He does have CPAP.  He also has a left nasal passage blockage.  Has seen ENT provider for this.  They are unwilling to do surgical correction until sleep apnea is under control.  Patient states that he has been having trouble using his CPAP due to the nasal passage blockage.  Has been having trouble getting in contact with provider seeing him for sleep apnea, thus has not been using his CPAP.  Patient has also been having balance issues.  This was initially recent he saw ENT provider.  Currently going to vestibular therapy.  He feels as though they are giving him exercises which will help tremendously.  Has been having some neck pain since diagnosis with upper respiratory infection.  Did go to urgent care for this.  Was treated with amoxicillin and prednisone.  Got a little better, but did not finish treatment regimen.  Also having nasal congestion, headache, and cough.  Denies fever, chills, or body aches.  Denies nausea, vomiting, or diarrhea.  Appetite is good.  Past Medical History:  Diagnosis Date   Asthma    Hypertension    Pre-diabetes     Past Surgical History:  Procedure Laterality Date   TONSILLECTOMY     TYMPANOSTOMY TUBE PLACEMENT      Family History  Problem Relation Age of Onset   Healthy Mother    Hypertension Father     Social History   Socioeconomic History   Marital status: Single    Spouse name: Not on file   Number of children: Not on file   Years of education: Not on file   Highest education level: Not on file  Occupational History   Not on file  Tobacco Use   Smoking status: Never   Smokeless  tobacco: Never  Vaping Use   Vaping Use: Never used  Substance and Sexual Activity   Alcohol use: Never   Drug use: Never   Sexual activity: Yes  Other Topics Concern   Not on file  Social History Narrative   Not on file   Social Determinants of Health   Financial Resource Strain: Not on file  Food Insecurity: Not on file  Transportation Needs: Not on file  Physical Activity: Not on file  Stress: Not on file  Social Connections: Not on file  Intimate Partner Violence: Not on file    ROS Review of Systems  Constitutional:  Positive for fatigue. Negative for activity change, chills and fever.  HENT:  Positive for congestion, ear pain, rhinorrhea, sinus pressure, sinus pain and sore throat. Negative for postnasal drip and sneezing.   Eyes: Negative.   Respiratory:  Positive for cough and wheezing. Negative for shortness of breath.   Cardiovascular:  Negative for chest pain and palpitations.  Gastrointestinal:  Negative for constipation, diarrhea, nausea and vomiting.  Endocrine: Negative for cold intolerance, heat intolerance, polydipsia and polyuria.  Genitourinary:  Negative for dysuria, frequency and urgency.  Musculoskeletal:  Positive for neck pain and neck stiffness. Negative for back pain and myalgias.  Skin:  Negative for rash.  Allergic/Immunologic: Positive for  environmental allergies.  Neurological:  Positive for dizziness, light-headedness and headaches. Negative for weakness.  Hematological:  Positive for adenopathy.  Psychiatric/Behavioral:  The patient is not nervous/anxious.    Objective:   Today's Vitals   10/11/20 1553 10/11/20 1629  BP: (!) 146/84 125/74  Pulse: 82   Temp: 98.6 F (37 C)   SpO2: 98%   Weight: 258 lb 3.2 oz (117.1 kg)   Height: 6' (1.829 m)    Body mass index is 35.02 kg/m.   Physical Exam Vitals and nursing note reviewed.  Constitutional:      Appearance: Normal appearance. He is well-developed. He is obese.  HENT:     Head:  Normocephalic and atraumatic.     Right Ear: Tympanic membrane is erythematous and bulging.     Left Ear: Tympanic membrane is erythematous and bulging.     Nose: Congestion and rhinorrhea present.     Right Turbinates: Enlarged.     Left Turbinates: Enlarged.     Right Sinus: Frontal sinus tenderness present.     Left Sinus: Frontal sinus tenderness present.     Mouth/Throat:     Pharynx: Posterior oropharyngeal erythema present.     Comments: Postnasal drip evident. Eyes:     Pupils: Pupils are equal, round, and reactive to light.  Cardiovascular:     Rate and Rhythm: Normal rate and regular rhythm.     Pulses: Normal pulses.     Heart sounds: Normal heart sounds.  Pulmonary:     Effort: Pulmonary effort is normal.     Breath sounds: Normal breath sounds.  Abdominal:     Palpations: Abdomen is soft.  Musculoskeletal:        General: Normal range of motion.     Cervical back: Normal range of motion and neck supple.  Lymphadenopathy:     Cervical: No cervical adenopathy.  Skin:    General: Skin is warm and dry.     Capillary Refill: Capillary refill takes less than 2 seconds.  Neurological:     General: No focal deficit present.     Mental Status: He is alert and oriented to person, place, and time.  Psychiatric:        Mood and Affect: Mood normal.        Behavior: Behavior normal.        Thought Content: Thought content normal.        Judgment: Judgment normal.    Assessment & Plan:  1. Encounter to establish care Appointment today to establish new primary care provider.  2. Acute recurrent pansinusitis Start treatment with amoxicillin suspension.  Take 10 mils twice daily for next 10 days.  We will also add prednisone taper.  Take as directed for 6 days. Rest and increase fluids. Continue using OTC medication to control symptoms.   - predniSONE (STERAPRED UNI-PAK 21 TAB) 10 MG (21) TBPK tablet; 6 day taper - take by mouth as directed for 6 days  Dispense: 21 tablet;  Refill: 0 - amoxicillin (AMOXIL) 400 MG/5ML suspension; Take 10 mLs (800 mg total) by mouth 2 (two) times daily.  Dispense: 200 mL; Refill: 0  3. Mild intermittent asthma, unspecified whether complicated Patient is to use rescue inhaler up to 4 times a day as needed for shortness of breath or wheezing.  May also use albuterol nebulizer as needed if rescue inhaler not effective.  New prescriptions for both were sent to his pharmacy. - albuterol (VENTOLIN HFA) 108 (90 Base) MCG/ACT inhaler; Use  2 inhalations QID prn wheezing, SOB  Dispense: 18 g; Refill: 3 - albuterol (PROVENTIL) (2.5 MG/3ML) 0.083% nebulizer solution; Take 3 mLs (2.5 mg total) by nebulization every 6 (six) hours as needed for wheezing or shortness of breath.  Dispense: 75 mL; Refill: 12  4. Obstructive sleep apnea hypopnea, severe Patient with previous diagnosis of severe sleep apnea.  Refer to sleep apnea specialist for further evaluation and management. - Ambulatory referral to Sleep Studies  5. Health care maintenance Routine, fasting labs ordered.  We will discuss results with patient in 4 weeks at next visit. - CBC; Future - Comp Met (CMET); Future - Hemoglobin A1c; Future - Lipid panel; Future - TSH + free T4; Future - Lipid panel - Hemoglobin A1c - Comp Met (CMET) - CBC - TSH + free T4   Problem List Items Addressed This Visit       Respiratory   Acute recurrent pansinusitis   Relevant Medications   predniSONE (STERAPRED UNI-PAK 21 TAB) 10 MG (21) TBPK tablet   amoxicillin (AMOXIL) 400 MG/5ML suspension   Mild intermittent asthma   Relevant Medications   predniSONE (STERAPRED UNI-PAK 21 TAB) 10 MG (21) TBPK tablet   albuterol (VENTOLIN HFA) 108 (90 Base) MCG/ACT inhaler   albuterol (PROVENTIL) (2.5 MG/3ML) 0.083% nebulizer solution   Obstructive sleep apnea hypopnea, severe   Relevant Orders   Ambulatory referral to Sleep Studies     Other   Encounter to establish care - Primary   Health care  maintenance   Relevant Orders   CBC   Comp Met (CMET)   Hemoglobin A1c   Lipid panel   TSH + free T4    Outpatient Encounter Medications as of 10/11/2020  Medication Sig   albuterol (PROVENTIL) (2.5 MG/3ML) 0.083% nebulizer solution Take 3 mLs (2.5 mg total) by nebulization every 6 (six) hours as needed for wheezing or shortness of breath.   amLODipine (NORVASC) 5 MG tablet Take 1 tablet (5 mg total) by mouth daily.   amoxicillin (AMOXIL) 400 MG/5ML suspension Take 10 mLs (800 mg total) by mouth 2 (two) times daily.   atenolol (TENORMIN) 100 MG tablet Take 1 tablet (100 mg total) by mouth daily.   Azelastine HCl 137 MCG/SPRAY SOLN USE 1 SPRAY(S) IN EACH NOSTRIL TWICE DAILY AS DIRECTED FOR 7 DAYS   chlorthalidone (HYGROTON) 25 MG tablet Take 1 tablet (25 mg total) by mouth daily.   EPINEPHrine 0.3 mg/0.3 mL IJ SOAJ injection Inject 0.3 mg into the muscle as needed for anaphylaxis.   Fluticasone-Salmeterol (ADVAIR) 100-50 MCG/DOSE AEPB 2 (two) times daily.   meclizine (ANTIVERT) 25 MG tablet Take 1-2 tablets (25-50 mg total) by mouth 2 (two) times daily as needed for dizziness.   predniSONE (STERAPRED UNI-PAK 21 TAB) 10 MG (21) TBPK tablet 6 day taper - take by mouth as directed for 6 days   [DISCONTINUED] albuterol (VENTOLIN HFA) 108 (90 Base) MCG/ACT inhaler INHALE 2 PUFFS BY MOUTH EVERY 6 HOURS AS NEEDED FOR WHEEZING AND FOR SHORTNESS OF BREATH   [DISCONTINUED] amoxicillin-clavulanate (AUGMENTIN) 875-125 MG tablet Take 1 tablet by mouth 2 (two) times daily.   [DISCONTINUED] doxycycline (VIBRAMYCIN) 100 MG capsule Take 1 capsule (100 mg total) by mouth 2 (two) times daily.   [DISCONTINUED] predniSONE (DELTASONE) 50 MG tablet 1 tablet daily x 5 days   albuterol (VENTOLIN HFA) 108 (90 Base) MCG/ACT inhaler Use 2 inhalations QID prn wheezing, SOB   No facility-administered encounter medications on file as of 10/11/2020.   This  note was dictated using Systems analyst. Rapid  proofreading was performed to expedite the delivery of the information. Despite proofreading, phonetic errors will occur which are common with this voice recognition software. Please take this into consideration. If there are any concerns, please contact our office.    Follow-up: Return in about 4 weeks (around 11/08/2020) for health maintenance exam - will need FBW with free T4 at time of visit .   Ronnell Freshwater, NP

## 2020-10-20 ENCOUNTER — Telehealth: Payer: Self-pay | Admitting: Nurse Practitioner

## 2020-10-20 ENCOUNTER — Telehealth: Payer: Self-pay

## 2020-10-20 NOTE — Telephone Encounter (Signed)
Pt called [facility] requesting refill.  We call pt back and advised him that he is seeing Herbert Seta and he would need to call her office for refills

## 2020-10-20 NOTE — Telephone Encounter (Signed)
Patients mother is calling requesting refill of Metformin for patient. I do not see Metformin active in medication list.    [facility] on [address] AS, CMA

## 2020-10-21 ENCOUNTER — Other Ambulatory Visit: Payer: Self-pay | Admitting: Nurse Practitioner

## 2020-10-21 DIAGNOSIS — E119 Type 2 diabetes mellitus without complications: Secondary | ICD-10-CM

## 2020-10-21 MED ORDER — METFORMIN HCL 850 MG PO TABS: 850.0000 mg | ORAL_TABLET | Freq: Two times a day (BID) | ORAL | 3 refills | Status: DC

## 2020-10-21 NOTE — Telephone Encounter (Signed)
Patients mother is calling again today in regards to the Metformin.

## 2020-10-21 NOTE — Telephone Encounter (Signed)
Can you find out the dosing? He did not list that on new patient paperwork and I do not have this in his current med list. I will send it in then.

## 2020-10-21 NOTE — Telephone Encounter (Signed)
New prescription for metformin 850 mg twice daily sent to [facility] on [address].

## 2020-10-21 NOTE — Progress Notes (Signed)
New prescription for metformin 850 mg twice daily sent to [facility] on [address].

## 2020-10-21 NOTE — Telephone Encounter (Signed)
Called pt no answer LVM to call the office 

## 2020-10-30 DIAGNOSIS — J0141 Acute recurrent pansinusitis: Secondary | ICD-10-CM | POA: Insufficient documentation

## 2020-10-30 DIAGNOSIS — J4520 Mild intermittent asthma, uncomplicated: Secondary | ICD-10-CM | POA: Insufficient documentation

## 2020-10-30 DIAGNOSIS — Z0000 Encounter for general adult medical examination without abnormal findings: Secondary | ICD-10-CM | POA: Insufficient documentation

## 2020-10-30 DIAGNOSIS — G4733 Obstructive sleep apnea (adult) (pediatric): Secondary | ICD-10-CM | POA: Insufficient documentation

## 2020-10-30 DIAGNOSIS — Z7689 Persons encountering health services in other specified circumstances: Secondary | ICD-10-CM | POA: Insufficient documentation

## 2020-10-30 DIAGNOSIS — J45909 Unspecified asthma, uncomplicated: Secondary | ICD-10-CM | POA: Insufficient documentation

## 2020-10-30 DIAGNOSIS — J0140 Acute pansinusitis, unspecified: Secondary | ICD-10-CM | POA: Insufficient documentation

## 2020-11-04 ENCOUNTER — Other Ambulatory Visit: Payer: Self-pay | Admitting: Nurse Practitioner

## 2020-11-04 DIAGNOSIS — Z7689 Persons encountering health services in other specified circumstances: Secondary | ICD-10-CM

## 2020-11-04 DIAGNOSIS — R5383 Other fatigue: Secondary | ICD-10-CM

## 2020-11-09 ENCOUNTER — Encounter: Payer: BC Managed Care – PPO | Admitting: Nurse Practitioner

## 2020-11-09 ENCOUNTER — Other Ambulatory Visit: Payer: Self-pay | Admitting: Nurse Practitioner

## 2020-11-09 DIAGNOSIS — L309 Dermatitis, unspecified: Secondary | ICD-10-CM

## 2020-11-10 ENCOUNTER — Encounter: Payer: Self-pay | Admitting: Nurse Practitioner

## 2020-11-10 ENCOUNTER — Other Ambulatory Visit: Payer: Self-pay | Admitting: Nurse Practitioner

## 2020-11-10 DIAGNOSIS — L309 Dermatitis, unspecified: Secondary | ICD-10-CM

## 2020-11-10 MED ORDER — TRIAMCINOLONE ACETONIDE 0.5 % EX OINT: 1.0000 "application " | TOPICAL_OINTMENT | Freq: Two times a day (BID) | CUTANEOUS | 3 refills | Status: DC

## 2020-12-08 ENCOUNTER — Encounter: Payer: BC Managed Care – PPO | Admitting: Nurse Practitioner

## 2020-12-14 ENCOUNTER — Encounter: Payer: Self-pay | Admitting: Nurse Practitioner

## 2020-12-30 ENCOUNTER — Ambulatory Visit (INDEPENDENT_AMBULATORY_CARE_PROVIDER_SITE_OTHER): Payer: BC Managed Care – PPO | Admitting: Nurse Practitioner

## 2020-12-30 ENCOUNTER — Encounter: Payer: Self-pay | Admitting: Nurse Practitioner

## 2020-12-30 ENCOUNTER — Other Ambulatory Visit: Payer: Self-pay

## 2020-12-30 VITALS — BP 117/74 | HR 85 | Temp 98.9°F | Ht 72.0 in | Wt 267.1 lb

## 2020-12-30 DIAGNOSIS — E119 Type 2 diabetes mellitus without complications: Secondary | ICD-10-CM

## 2020-12-30 DIAGNOSIS — J4520 Mild intermittent asthma, uncomplicated: Secondary | ICD-10-CM

## 2020-12-30 DIAGNOSIS — R2689 Other abnormalities of gait and mobility: Secondary | ICD-10-CM | POA: Diagnosis not present

## 2020-12-30 DIAGNOSIS — Z6836 Body mass index (BMI) 36.0-36.9, adult: Secondary | ICD-10-CM

## 2020-12-30 DIAGNOSIS — G969 Disorder of central nervous system, unspecified: Secondary | ICD-10-CM

## 2020-12-30 DIAGNOSIS — R519 Headache, unspecified: Secondary | ICD-10-CM

## 2020-12-30 DIAGNOSIS — Z0001 Encounter for general adult medical examination with abnormal findings: Secondary | ICD-10-CM

## 2020-12-30 DIAGNOSIS — G8929 Other chronic pain: Secondary | ICD-10-CM

## 2020-12-30 LAB — POCT UA - MICROALBUMIN
Albumin/Creatinine Ratio, Urine, POC: <30
Creatinine, POC: 300 mg/dL

## 2020-12-30 LAB — POCT GLYCOSYLATED HEMOGLOBIN (HGB A1C): Hemoglobin A1C: 8.1 % — AB (ref 4.0–5.6)

## 2020-12-30 MED ORDER — ALBUTEROL SULFATE HFA 108 (90 BASE) MCG/ACT IN AERS: INHALATION_SPRAY | RESPIRATORY_TRACT | 3 refills | Status: DC

## 2020-12-30 MED ORDER — EMPAGLIFLOZIN 10 MG PO TABS: 10.0000 mg | ORAL_TABLET | Freq: Every day | ORAL | 2 refills | Status: AC

## 2020-12-30 NOTE — Progress Notes (Signed)
Established Patient Office Visit  Subjective:  Patient ID: Miguel Kent, male    DOB: 03-13-1990  Age: 30 y.o. MRN: [id number]  CC:  Chief Complaint  Patient presents with   Annual Exam   Follow-up     HPI Miguel Kent presents for annual exam. Patient having balance issues. Has seen vestibular therapist I nthe past and was told he had central nervous system disorder. Therapist was part of [facility]. An ENT specialist told him he had severe nasal blockage. Did not want to surgically remove it because of the severe sleep apnea. A second opinion told him that he had vestibular problem. Vestibular therapist believes problem is cental nervous system disorder. He will be seeing third ENT provider in the near future.  Patient does need to have a sleep medicine provider. Has severe sleep apnea and usus CPAP. He continues to wake up feeling very tired. Sometimes he is falling asleep while working. Has had to miss several days of work due to symptoms. There are weeks when he has to miss a day or two and then may go a few weeks without having to miss work. Then will have a few days in another week. He states that some nights he is unable to use the CPAP at all. But some nights he can use it. Very difficult for him to get the air in due to the nasal blockage. He has brought FMLA paperwork to be completed and sent to his employer.  Blood sugars not well managed. HgbA1c is 8.1 today. States that he is not taking metformin. Is nervous about potential side effects so is not taking at all.   Past Medical History:  Diagnosis Date   Asthma    Hypertension    Pre-diabetes     Past Surgical History:  Procedure Laterality Date   TONSILLECTOMY     TYMPANOSTOMY TUBE PLACEMENT      Family History  Problem Relation Age of Onset   Healthy Mother    Hypertension Father     Social History   Socioeconomic History   Marital status: Single    Spouse name: Not on file   Number of children:  Not on file   Years of education: Not on file   Highest education level: Not on file  Occupational History   Not on file  Tobacco Use   Smoking status: Never   Smokeless tobacco: Never  Vaping Use   Vaping Use: Never used  Substance and Sexual Activity   Alcohol use: Never   Drug use: Never   Sexual activity: Yes  Other Topics Concern   Not on file  Social History Narrative   Not on file   Social Determinants of Health   Financial Resource Strain: Not on file  Food Insecurity: Not on file  Transportation Needs: Not on file  Physical Activity: Not on file  Stress: Not on file  Social Connections: Not on file  Intimate Partner Violence: Not on file    Outpatient Medications Prior to Visit  Medication Sig Dispense Refill   albuterol (PROVENTIL) (2.5 MG/3ML) 0.083% nebulizer solution Take 3 mLs (2.5 mg total) by nebulization every 6 (six) hours as needed for wheezing or shortness of breath. 75 mL 12   amLODipine (NORVASC) 5 MG tablet Take 1 tablet (5 mg total) by mouth daily. 30 tablet 2   atenolol (TENORMIN) 100 MG tablet Take 1 tablet (100 mg total) by mouth daily. 90 tablet 3   Azelastine HCl 137  MCG/SPRAY SOLN USE 1 SPRAY(S) IN EACH NOSTRIL TWICE DAILY AS DIRECTED FOR 7 DAYS 60 mL 3   chlorthalidone (HYGROTON) 25 MG tablet Take 1 tablet (25 mg total) by mouth daily. 90 tablet 0   EPINEPHrine 0.3 mg/0.3 mL IJ SOAJ injection Inject 0.3 mg into the muscle as needed for anaphylaxis. 1 each 1   Fluticasone-Salmeterol (ADVAIR) 100-50 MCG/DOSE AEPB 2 (two) times daily.     meclizine (ANTIVERT) 25 MG tablet Take 1-2 tablets (25-50 mg total) by mouth 2 (two) times daily as needed for dizziness. 30 tablet 0   predniSONE (STERAPRED UNI-PAK 21 TAB) 10 MG (21) TBPK tablet 6 day taper - take by mouth as directed for 6 days 21 tablet 0   triamcinolone ointment (KENALOG) 0.5 % Apply 1 application topically 2 (two) times daily. 30 g 3   albuterol (VENTOLIN HFA) 108 (90 Base) MCG/ACT inhaler  Use 2 inhalations QID prn wheezing, SOB 18 g 3   amoxicillin (AMOXIL) 400 MG/5ML suspension Take 10 mLs (800 mg total) by mouth 2 (two) times daily. 200 mL 0   metFORMIN (GLUCOPHAGE) 850 MG tablet Take 1 tablet (850 mg total) by mouth 2 (two) times daily with a meal. 60 tablet 3   No facility-administered medications prior to visit.    Allergies  Allergen Reactions   Peanut Oil Anaphylaxis   Peanut-Containing Drug Products Anaphylaxis    ROS Review of Systems  Constitutional:  Positive for fatigue. Negative for activity change, chills and fever.  HENT:  Positive for congestion and sinus pressure. Negative for postnasal drip, rhinorrhea, sinus pain, sneezing and sore throat.   Eyes: Negative.   Respiratory:  Negative for cough, shortness of breath and wheezing.   Cardiovascular:  Negative for chest pain and palpitations.  Gastrointestinal:  Negative for constipation, diarrhea, nausea and vomiting.  Endocrine: Negative for cold intolerance, heat intolerance, polydipsia and polyuria.       Very elevated sugars with HgbA1c at 8.1 today. Patient not taking metformin as prescribed   Genitourinary:  Negative for dysuria, frequency and urgency.  Musculoskeletal:  Negative for back pain and myalgias.  Skin:  Negative for rash.  Allergic/Immunologic: Positive for environmental allergies.  Neurological:  Positive for headaches. Negative for dizziness and weakness.  Psychiatric/Behavioral:  The patient is not nervous/anxious.      Objective:    Physical Exam Vitals and nursing note reviewed.  Constitutional:      Appearance: Normal appearance. He is well-developed. He is obese.  HENT:     Head: Normocephalic and atraumatic.     Right Ear: Tympanic membrane, ear canal and external ear normal.     Left Ear: Tympanic membrane, ear canal and external ear normal.     Nose: Congestion present.     Mouth/Throat:     Mouth: Mucous membranes are moist.     Pharynx: Oropharynx is clear.  Eyes:      Extraocular Movements: Extraocular movements intact.     Conjunctiva/sclera: Conjunctivae normal.     Pupils: Pupils are equal, round, and reactive to light.  Cardiovascular:     Rate and Rhythm: Normal rate and regular rhythm.     Pulses: Normal pulses.     Heart sounds: Normal heart sounds.  Pulmonary:     Effort: Pulmonary effort is normal.     Breath sounds: Normal breath sounds.  Abdominal:     General: Bowel sounds are normal. There is no distension.     Palpations: Abdomen is soft. There is  no mass.     Tenderness: There is no abdominal tenderness. There is no guarding or rebound.     Hernia: No hernia is present.  Musculoskeletal:        General: Normal range of motion.     Cervical back: Normal range of motion and neck supple.  Lymphadenopathy:     Cervical: No cervical adenopathy.  Skin:    General: Skin is warm and dry.     Capillary Refill: Capillary refill takes less than 2 seconds.  Neurological:     General: No focal deficit present.     Mental Status: He is alert and oriented to person, place, and time.  Psychiatric:        Mood and Affect: Mood normal.        Behavior: Behavior normal.        Thought Content: Thought content normal.        Judgment: Judgment normal.   Today's Vitals   12/30/20 1614  BP: 117/74  Pulse: 85  Temp: 98.9 F (37.2 C)  SpO2: 96%  Weight: 267 lb 1.6 oz (121.2 kg)  Height: 6' (1.829 m)   Body mass index is 36.23 kg/m.   Wt Readings from Last 3 Encounters:  12/30/20 267 lb 1.6 oz (121.2 kg)  10/11/20 258 lb 3.2 oz (117.1 kg)  07/21/20 252 lb (114.3 kg)     Health Maintenance Due  Topic Date Due   Pneumococcal Vaccine 58-54 Years old (1 - PCV) Never done   FOOT EXAM  Never done   OPHTHALMOLOGY EXAM  Never done   HIV Screening  Never done   Hepatitis C Screening  Never done   INFLUENZA VACCINE  Never done    There are no preventive care reminders to display for this patient.  No results found for: TSH No  results found for: WBC, HGB, HCT, MCV, PLT No results found for: NA, K, CHLORIDE, CO2, GLUCOSE, BUN, CREATININE, BILITOT, ALKPHOS, AST, ALT, PROT, ALBUMIN, CALCIUM, ANIONGAP, EGFR, GFR No results found for: CHOL No results found for: HDL No results found for: LDLCALC No results found for: TRIG No results found for: CHOLHDL Lab Results  Component Value Date   HGBA1C 8.1 (A) 12/30/2020      Assessment & Plan:  1. Encounter for general adult medical examination with abnormal findings Annual wellness visit.   2. Type 2 diabetes mellitus without complication, without long-term current use of insulin (HCC) HgbA1c 8.1 today. Trial Jardiance 28m daily. Encouraged the patient to monitor his blood sugars daily, prior to eating. The goal is to have his fastign blood sugars between 70+120. Recheck HgbA1c in 3 months.  - POCT glycosylated hemoglobin (Hb A1C) - empagliflozin (JARDIANCE) 10 MG TABS tablet; Take 1 tablet (10 mg total) by mouth daily before breakfast.  Dispense: 30 tablet; Refill: 2 - POCT UA - Microalbumin  3. Balance disorder Referral to neurology for further evaluation.  - Ambulatory referral to Neurology  4. Central nervous system disorder Referral to neurology for further evaluation.  - Ambulatory referral to Neurology  5. Chronic nonintractable headache, unspecified headache type Referral to neurology for further evaluation.  - Ambulatory referral to Neurology  6. Mild intermittent asthma, unspecified whether complicated Albuterol rescue inhaler. Use as needed and as prescribed for shortness of breath and wheezing.  - albuterol (VENTOLIN HFA) 108 (90 Base) MCG/ACT inhaler; Use 2 inhalations QID prn wheezing, SOB  Dispense: 18 g; Refill: 3  7. Body mass index (BMI) of 36.0-36.9 in  adult Encourage patient to limit calorie intake to 2000 cal/day or less.  He should consume a low cholesterol, low-fat diet.  Patient should incorporate exercise into his daily routine.      Problem List Items Addressed This Visit       Respiratory   Mild intermittent asthma   Relevant Medications   albuterol (VENTOLIN HFA) 108 (90 Base) MCG/ACT inhaler     Endocrine   Type 2 diabetes mellitus without complication, without long-term current use of insulin (HCC)   Relevant Medications   empagliflozin (JARDIANCE) 10 MG TABS tablet   Other Relevant Orders   POCT glycosylated hemoglobin (Hb A1C) (Completed)   POCT UA - Microalbumin (Completed)     Nervous and Auditory   Central nervous system disorder   Relevant Orders   Ambulatory referral to Neurology     Other   Balance disorder   Relevant Orders   Ambulatory referral to Neurology   Chronic nonintractable headache   Relevant Orders   Ambulatory referral to Neurology   Body mass index (BMI) of 36.0-36.9 in adult   Other Visit Diagnoses     Encounter for general adult medical examination with abnormal findings    -  Primary       Meds ordered this encounter  Medications   empagliflozin (JARDIANCE) 10 MG TABS tablet    Sig: Take 1 tablet (10 mg total) by mouth daily before breakfast.    Dispense:  30 tablet    Refill:  2    Unable to tolerate metformin    Order Specific Question:   Supervising Provider    Answer:   Beatrice Lecher D [[id number]]   albuterol (VENTOLIN HFA) 108 (90 Base) MCG/ACT inhaler    Sig: Use 2 inhalations QID prn wheezing, SOB    Dispense:  18 g    Refill:  3    Order Specific Question:   Supervising Provider    Answer:   Beatrice Lecher D [[id number]]     Follow-up: Return in about 3 months (around 03/30/2021) for diabetes with HgbA1c check - see below.    Ronnell Freshwater, NP

## 2021-01-02 DIAGNOSIS — G969 Disorder of central nervous system, unspecified: Secondary | ICD-10-CM | POA: Insufficient documentation

## 2021-01-02 DIAGNOSIS — R2689 Other abnormalities of gait and mobility: Secondary | ICD-10-CM | POA: Insufficient documentation

## 2021-01-02 DIAGNOSIS — E119 Type 2 diabetes mellitus without complications: Secondary | ICD-10-CM | POA: Insufficient documentation

## 2021-01-02 DIAGNOSIS — Z6836 Body mass index (BMI) 36.0-36.9, adult: Secondary | ICD-10-CM | POA: Insufficient documentation

## 2021-01-02 DIAGNOSIS — R519 Headache, unspecified: Secondary | ICD-10-CM | POA: Insufficient documentation

## 2021-01-02 NOTE — Patient Instructions (Signed)
Fat and Cholesterol Restricted Eating Plan Getting too much fat and cholesterol in your diet may cause health problems. Choosing the right foods helps keep your fat and cholesterol at normal levels. This can keep you from getting certain diseases. Your doctor may recommend an eating plan that includes: Total fat: ______% or less of total calories a day. This is ______g of fat a day. Saturated fat: ______% or less of total calories a day. This is ______g of saturated fat a day. Cholesterol: less than _________mg a day. Fiber: ______g a day. What are tips for following this plan? General tips Work with your doctor to lose weight if you need to. Avoid: Foods with added sugar. Fried foods. Foods with trans fat or partially hydrogenated oils. This includes some margarines and baked goods. If you drink alcohol: Limit how much you have to: 0-1 drink a day for women who are not pregnant. 0-2 drinks a day for men. Know how much alcohol is in a drink. In the [place] one drink equals one 12 oz bottle of beer (355 mL), one 5 oz glass of wine (148 mL), or one 1 oz glass of hard liquor (44 mL). Reading food labels Check food labels for: Trans fats. Partially hydrogenated oils. Saturated fat (g) in each serving. Cholesterol (mg) in each serving. Fiber (g) in each serving. Choose foods with healthy fats, such as: Monounsaturated fats and polyunsaturated fats. These include olive and canola oil, flaxseeds, walnuts, almonds, and seeds. Omega-3 fats. These are found in certain fish, flaxseed oil, and ground flaxseeds. Choose grain products that have whole grains. Look for the word "whole" as the first word in the ingredient list. Cooking Cook foods using low-fat methods. These include baking, boiling, grilling, and broiling. Eat more home-cooked foods. Eat at restaurants and buffets less often. Eat less fast food. Avoid cooking using saturated fats, such as butter, cream, palm oil, palm kernel oil, and  coconut oil. Meal planning  At meals, divide your plate into four equal parts: Fill one-half of your plate with vegetables, green salads, and fruit. Fill one-fourth of your plate with whole grains. Fill one-fourth of your plate with low-fat (lean) protein foods. Eat fish that is high in omega-3 fats at least two times a week. This includes mackerel, tuna, sardines, and salmon. Eat foods that are high in fiber, such as whole grains, beans, apples, pears, berries, broccoli, carrots, peas, and barley. What foods should I eat? Fruits All fresh, canned (in natural juice), or frozen fruits. Vegetables Fresh or frozen vegetables (raw, steamed, roasted, or grilled). Green salads. Grains Whole grains, such as whole wheat or whole grain breads, crackers, cereals, and pasta. Unsweetened oatmeal, bulgur, barley, quinoa, or brown rice. Corn or whole wheat flour tortillas. Meats and other protein foods Ground beef (85% or leaner), grass-fed beef, or beef trimmed of fat. Skinless chicken or turkey. Ground chicken or turkey. Pork trimmed of fat. All fish and seafood. Egg whites. Dried beans, peas, or lentils. Unsalted nuts or seeds. Unsalted canned beans. Nut butters without added sugar or oil. Dairy Low-fat or nonfat dairy products, such as skim or 1% milk, 2% or reduced-fat cheeses, low-fat and fat-free ricotta or cottage cheese, or plain low-fat and nonfat yogurt. Fats and oils Tub margarine without trans fats. Light or reduced-fat mayonnaise and salad dressings. Avocado. Olive, canola, sesame, or safflower oils. The items listed above may not be a complete list of foods and beverages you can eat. Contact a dietitian for more information. What foods   should I avoid? Fruits Canned fruit in heavy syrup. Fruit in cream or butter sauce. Fried fruit. Vegetables Vegetables cooked in cheese, cream, or butter sauce. Fried vegetables. Grains White bread. White pasta. White rice. Cornbread. Bagels, pastries,  and croissants. Crackers and snack foods that contain trans fat and hydrogenated oils. Meats and other protein foods Fatty cuts of meat. Ribs, chicken wings, bacon, sausage, bologna, salami, chitterlings, fatback, hot dogs, bratwurst, and packaged lunch meats. Liver and organ meats. Whole eggs and egg yolks. Chicken and turkey with skin. Fried meat. Dairy Whole or 2% milk, cream, half-and-half, and cream cheese. Whole milk cheeses. Whole-fat or sweetened yogurt. Full-fat cheeses. Nondairy creamers and whipped toppings. Processed cheese, cheese spreads, and cheese curds. Fats and oils Butter, stick margarine, lard, shortening, ghee, or bacon fat. Coconut, palm kernel, and palm oils. Beverages Alcohol. Sugar-sweetened drinks such as sodas, lemonade, and fruit drinks. Sweets and desserts Corn syrup, sugars, honey, and molasses. Candy. Jam and jelly. Syrup. Sweetened cereals. Cookies, pies, cakes, donuts, muffins, and ice cream. The items listed above may not be a complete list of foods and beverages you should avoid. Contact a dietitian for more information. Summary Choosing the right foods helps keep your fat and cholesterol at normal levels. This can keep you from getting certain diseases. At meals, fill one-half of your plate with vegetables, green salads, and fruits. Eat high fiber foods, like whole grains, beans, apples, pears, berries, carrots, peas, and barley. Limit added sugar, saturated fats, alcohol, and fried foods. This information is not intended to replace advice given to you by your health care provider. Make sure you discuss any questions you have with your health care provider. Document Revised: 05/21/2020 Document Reviewed: 05/21/2020 Elsevier Patient Education  2022 Elsevier Inc.  

## 2021-01-17 ENCOUNTER — Other Ambulatory Visit: Payer: Self-pay | Admitting: Nurse Practitioner

## 2021-01-17 DIAGNOSIS — E119 Type 2 diabetes mellitus without complications: Secondary | ICD-10-CM

## 2021-02-15 ENCOUNTER — Encounter: Payer: Self-pay | Admitting: Neurology

## 2021-02-15 ENCOUNTER — Ambulatory Visit: Payer: BC Managed Care – PPO | Admitting: Neurology

## 2021-02-15 VITALS — BP 149/105 | HR 91 | Ht 72.0 in | Wt 268.0 lb

## 2021-02-15 DIAGNOSIS — R42 Dizziness and giddiness: Secondary | ICD-10-CM

## 2021-02-15 DIAGNOSIS — H538 Other visual disturbances: Secondary | ICD-10-CM | POA: Insufficient documentation

## 2021-02-15 NOTE — Progress Notes (Signed)
Chief Complaint  Patient presents with   New Patient (Initial Visit)    RM 14, with mother. PCP/Referring: Vincent Gros, NP  C/o balance issues. Denies falling/tripping. C/o pressure in the back of his head when getting off elevator or if there is a sharp turn in the car. C/o occasional bilateral hand numbness. Vibrating sensation in back of head.      ASSESSMENT AND PLAN  Miguel Kent is a 31 y.o. male     DIAGNOSTIC DATA (LABS, IMAGING, TESTING) - I reviewed patient records, labs, notes, testing and imaging myself where available.   MEDICAL HISTORY:  [facility], seen in request by   Carlean Jews, NP with other,     I reviewed and summarized the referring note. Pmhx HTN DM Asthma  [employer], rides in the care, tingling   When he wake up, vibrating in the back of head,   2022, sit down for two long, too long, frozen, he has to move,   Job, sit down,  hard [place] balcne, back and forth,  climb step, sit, motion  Heart racing,  moving fast,   Tilt,  Car ride, quick turn, Engineer, structural,  elvator,   Pressre behand left ear,   Left are   Past Medical History:  Diagnosis Date   Asthma    Hypertension    Pre-diabetes       PHYSICAL EXAM:   Vitals:   02/15/21 0744  BP: (!) 149/105  Pulse: 91  Weight: 268 lb (121.6 kg)  Height: 6' (1.829 m)   Not recorded     Body mass index is 36.35 kg/m.  PHYSICAL EXAMNIATION:  Gen: NAD, conversant, well nourised, well groomed                     Cardiovascular: Regular rate rhythm, no peripheral edema, warm, nontender. Eyes: Conjunctivae clear without exudates or hemorrhage Neck: Supple, no carotid bruits. Pulmonary: Clear to auscultation bilaterally   NEUROLOGICAL EXAM:  MENTAL STATUS: Speech:    Speech is normal; fluent and spontaneous with normal comprehension.  Cognition:     Orientation to time, place and person     Normal recent and remote memory     Normal Attention span and  concentration     Normal Language, naming, repeating,spontaneous speech     Fund of knowledge   CRANIAL NERVES: CN II: Visual fields are full to confrontation. Pupils are round equal and briskly reactive to light. CN III, IV, VI: extraocular movement are normal. No ptosis. CN V: Facial sensation is intact to light touch CN VII: Face is symmetric with normal eye closure  CN VIII: Hearing is normal to causal conversation. CN IX, X: Phonation is normal. CN XI: Head turning and shoulder shrug are intact  MOTOR: There is no pronator drift of out-stretched arms. Muscle bulk and tone are normal. Muscle strength is normal.  REFLEXES: Reflexes are 2+ and symmetric at the biceps, triceps, knees, and ankles. Plantar responses are flexor.  SENSORY: Intact to light touch, pinprick and vibratory sensation are intact in fingers and toes.  COORDINATION: There is no trunk or limb dysmetria noted.  GAIT/STANCE: Posture is normal. Gait is steady with normal steps, base, arm swing, and turning. Heel and toe walking are normal. Tandem gait is normal.  Romberg is absent.  REVIEW OF SYSTEMS:  Full 14 system review of systems performed and notable only for as above All other review of systems were negative.   ALLERGIES: Allergies  Allergen Reactions   Peanut Oil Anaphylaxis   Peanut-Containing Drug Products Anaphylaxis    HOME MEDICATIONS: Current Outpatient Medications  Medication Sig Dispense Refill   albuterol (PROVENTIL) (2.5 MG/3ML) 0.083% nebulizer solution Take 3 mLs (2.5 mg total) by nebulization every 6 (six) hours as needed for wheezing or shortness of breath. 75 mL 12   albuterol (VENTOLIN HFA) 108 (90 Base) MCG/ACT inhaler Use 2 inhalations QID prn wheezing, SOB 18 g 3   atenolol-chlorthalidone (TENORETIC) 100-25 MG tablet Take 1 tablet by mouth daily.     Azelastine HCl 137 MCG/SPRAY SOLN USE 1 SPRAY(S) IN EACH NOSTRIL TWICE DAILY AS DIRECTED FOR 7 DAYS 60 mL 3   empagliflozin  (JARDIANCE) 10 MG TABS tablet Take 1 tablet (10 mg total) by mouth daily before breakfast. 30 tablet 2   EPINEPHrine 0.3 mg/0.3 mL IJ SOAJ injection Inject 0.3 mg into the muscle as needed for anaphylaxis. 1 each 1   Fluticasone-Salmeterol (ADVAIR) 100-50 MCG/DOSE AEPB 2 (two) times daily.     triamcinolone ointment (KENALOG) 0.5 % Apply 1 application topically 2 (two) times daily. 30 g 3   No current facility-administered medications for this visit.    PAST MEDICAL HISTORY: Past Medical History:  Diagnosis Date   Asthma    Hypertension    Pre-diabetes     PAST SURGICAL HISTORY: Past Surgical History:  Procedure Laterality Date   TONSILLECTOMY     TYMPANOSTOMY TUBE PLACEMENT      FAMILY HISTORY: Family History  Problem Relation Age of Onset   Healthy Mother    Hypertension Father     SOCIAL HISTORY: Social History   Socioeconomic History   Marital status: Single    Spouse name: Not on file   Number of children: Not on file   Years of education: Not on file   Highest education level: Not on file  Occupational History   Not on file  Tobacco Use   Smoking status: Never   Smokeless tobacco: Never  Vaping Use   Vaping Use: Never used  Substance and Sexual Activity   Alcohol use: Never   Drug use: Never   Sexual activity: Yes  Other Topics Concern   Not on file  Social History Narrative   Not on file   Social Determinants of Health   Financial Resource Strain: Not on file  Food Insecurity: Not on file  Transportation Needs: Not on file  Physical Activity: Not on file  Stress: Not on file  Social Connections: Not on file  Intimate Partner Violence: Not on file      Levert Feinstein, M.D. Ph.D.  [facility] [address], [address] [place], [place] [address] Ph: [phone] Fax: [phone]  CC:  Carlean Jews, NP [address] [place],  [place] [address]  Carlean Jews, NP    `

## 2021-02-15 NOTE — Progress Notes (Signed)
Chief Complaint  Patient presents with   New Patient (Initial Visit)    RM 24, with mother. PCP/Referring: Leretha Pol, NP  C/o balance issues. Denies falling/tripping. C/o pressure in the back of his head when getting off elevator or if there is a sharp turn in the car. C/o occasional bilateral hand numbness. Vibrating sensation in back of head.      ASSESSMENT AND PLAN  Miguel Kent is a 31 y.o. male   New onset dizziness, headache,  Subjective decreased hearing on the left ear, dizziness,  Suggestive of vestibular malfunction,  MRI of the brain through internal acoustic canal with without contrast to rule out structural abnormality   DIAGNOSTIC DATA (LABS, IMAGING, TESTING) - I reviewed patient records, labs, notes, testing and imaging myself where available.   MEDICAL HISTORY:  Miguel Kent, seen in request by   Miguel Freshwater, NP  I reviewed and summarized the referring note.  Past medical history Hypertension Diabetes Obesity Obstructive sleep apnea on CPAP machine  Patient denies a history of motion sickness, since 2022, he noticed this constellation of symptoms, taking elevator or escalator, when he reached the top, he also felt unsteady sensation, even for a prolonged car ride, he also did feel unsteady, dizziness sensation, sometimes if he sit in certain spot for prolonged period of time, he would feel the surrounding was moving, it is difficult for him to refocus, he also complains of decreased hearing on the left ear, occasionally bioccipital area headaches,  He denies dysarthria, no lateralized motor or sensory deficit  PHYSICAL EXAM:   Vitals:   02/15/21 0744  BP: (!) 149/105  Pulse: 91  Weight: 268 lb (121.6 kg)  Height: 6' (1.829 m)   Not recorded     Body mass index is 36.35 kg/m.  PHYSICAL EXAMNIATION:  Gen: NAD, conversant, well nourised, well groomed                     Cardiovascular: Regular rate rhythm, no peripheral edema,  warm, nontender. Eyes: Conjunctivae clear without exudates or hemorrhage Neck: Supple, no carotid bruits. Pulmonary: Clear to auscultation bilaterally   NEUROLOGICAL EXAM:  MENTAL STATUS: Speech:    Speech is normal; fluent and spontaneous with normal comprehension.  Cognition:     Orientation to time, place and person     Normal recent and remote memory     Normal Attention span and concentration     Normal Language, naming, repeating,spontaneous speech     Fund of knowledge   CRANIAL NERVES: CN II: Visual fields are full to confrontation. Pupils are round equal and briskly reactive to light. CN III, IV, VI: extraocular movement are normal. No ptosis. CN V: Facial sensation is intact to light touch CN VII: Face is symmetric with normal eye closure  CN VIII: Hearing is normal to causal conversation.  [place] is towards the right, air conduction is more than bony conduction bilaterally CN IX, X: Phonation is normal. CN XI: Head turning and shoulder shrug are intact CN XII: Narrow oropharyngeal space  MOTOR: There is no pronator drift of out-stretched arms. Muscle bulk and tone are normal. Muscle strength is normal.  REFLEXES: Reflexes are 2+ and symmetric at the biceps, triceps, knees, and ankles. Plantar responses are flexor.  SENSORY: Intact to light touch, pinprick and vibratory sensation are intact in fingers and toes.  COORDINATION: There is no trunk or limb dysmetria noted.  GAIT/STANCE: Posture is normal. Gait is steady with normal steps,  base, arm swing, and turning. Heel and toe walking are normal. Tandem gait is normal.  Romberg is absent.  REVIEW OF SYSTEMS:  Full 14 system review of systems performed and notable only for as above All other review of systems were negative.   ALLERGIES: Allergies  Allergen Reactions   Peanut Oil Anaphylaxis   Peanut-Containing Drug Products Anaphylaxis    HOME MEDICATIONS: Current Outpatient Medications  Medication Sig  Dispense Refill   albuterol (PROVENTIL) (2.5 MG/3ML) 0.083% nebulizer solution Take 3 mLs (2.5 mg total) by nebulization every 6 (six) hours as needed for wheezing or shortness of breath. 75 mL 12   albuterol (VENTOLIN HFA) 108 (90 Base) MCG/ACT inhaler Use 2 inhalations QID prn wheezing, SOB 18 g 3   atenolol-chlorthalidone (TENORETIC) 100-25 MG tablet Take 1 tablet by mouth daily.     Azelastine HCl 137 MCG/SPRAY SOLN USE 1 SPRAY(S) IN EACH NOSTRIL TWICE DAILY AS DIRECTED FOR 7 DAYS 60 mL 3   empagliflozin (JARDIANCE) 10 MG TABS tablet Take 1 tablet (10 mg total) by mouth daily before breakfast. 30 tablet 2   EPINEPHrine 0.3 mg/0.3 mL IJ SOAJ injection Inject 0.3 mg into the muscle as needed for anaphylaxis. 1 each 1   Fluticasone-Salmeterol (ADVAIR) 100-50 MCG/DOSE AEPB 2 (two) times daily.     triamcinolone ointment (KENALOG) 0.5 % Apply 1 application topically 2 (two) times daily. 30 g 3   No current facility-administered medications for this visit.    PAST MEDICAL HISTORY: Past Medical History:  Diagnosis Date   Asthma    Hypertension    Pre-diabetes     PAST SURGICAL HISTORY: Past Surgical History:  Procedure Laterality Date   TONSILLECTOMY     TYMPANOSTOMY TUBE PLACEMENT      FAMILY HISTORY: Family History  Problem Relation Age of Onset   Healthy Mother    Hypertension Father     SOCIAL HISTORY: Social History   Socioeconomic History   Marital status: Single    Spouse name: Not on file   Number of children: Not on file   Years of education: Not on file   Highest education level: Not on file  Occupational History   Not on file  Tobacco Use   Smoking status: Never   Smokeless tobacco: Never  Vaping Use   Vaping Use: Never used  Substance and Sexual Activity   Alcohol use: Never   Drug use: Never   Sexual activity: Yes  Other Topics Concern   Not on file  Social History Narrative   Not on file   Social Determinants of Health   Financial Resource  Strain: Not on file  Food Insecurity: Not on file  Transportation Needs: Not on file  Physical Activity: Not on file  Stress: Not on file  Social Connections: Not on file  Intimate Partner Violence: Not on file      Miguel Kent, M.D. Ph.D.  [facility] [address], [place], [place] [address] Ph: [phone] Fax: [phone]  CC:  Miguel Freshwater, NP [place],  [place] [address]  Miguel Freshwater, NP

## 2021-02-16 ENCOUNTER — Telehealth: Payer: Self-pay | Admitting: Neurology

## 2021-02-16 NOTE — Telephone Encounter (Signed)
spoke to the patient he is going to give me a call back  [employer] auth: [id number] (exp. 02/15/21 to 03/16/21)

## 2021-03-08 ENCOUNTER — Other Ambulatory Visit: Payer: Self-pay | Admitting: Nurse Practitioner

## 2021-03-08 ENCOUNTER — Other Ambulatory Visit (HOSPITAL_COMMUNITY): Payer: Self-pay

## 2021-03-08 ENCOUNTER — Encounter: Payer: Self-pay | Admitting: Nurse Practitioner

## 2021-03-08 DIAGNOSIS — I10 Essential (primary) hypertension: Secondary | ICD-10-CM

## 2021-03-08 DIAGNOSIS — I270 Primary pulmonary hypertension: Secondary | ICD-10-CM

## 2021-03-08 MED ORDER — ATENOLOL-CHLORTHALIDONE 100-25 MG PO TABS: 1.0000 | ORAL_TABLET | Freq: Every day | ORAL | 1 refills | Status: DC

## 2021-03-08 MED ORDER — ATENOLOL-CHLORTHALIDONE 100-25 MG PO TABS
1.0000 | ORAL_TABLET | Freq: Every day | ORAL | 1 refills | Status: DC
Start: 2021-03-08 — End: 2021-03-08
  Filled 2021-03-08: qty 90, 90d supply, fill #0

## 2021-03-08 NOTE — Telephone Encounter (Signed)
Patient has follow up 03/30/2021

## 2021-03-09 ENCOUNTER — Other Ambulatory Visit (HOSPITAL_COMMUNITY): Payer: Self-pay

## 2021-03-18 ENCOUNTER — Encounter: Payer: Self-pay | Admitting: Nurse Practitioner

## 2021-03-18 ENCOUNTER — Other Ambulatory Visit: Payer: Self-pay

## 2021-03-18 ENCOUNTER — Ambulatory Visit (INDEPENDENT_AMBULATORY_CARE_PROVIDER_SITE_OTHER): Payer: BC Managed Care – PPO | Admitting: Nurse Practitioner

## 2021-03-18 VITALS — BP 128/64 | HR 89 | Temp 98.4°F | Ht 72.0 in | Wt 267.4 lb

## 2021-03-18 DIAGNOSIS — J342 Deviated nasal septum: Secondary | ICD-10-CM

## 2021-03-18 DIAGNOSIS — I10 Essential (primary) hypertension: Secondary | ICD-10-CM

## 2021-03-18 DIAGNOSIS — E119 Type 2 diabetes mellitus without complications: Secondary | ICD-10-CM

## 2021-03-18 DIAGNOSIS — Z0000 Encounter for general adult medical examination without abnormal findings: Secondary | ICD-10-CM

## 2021-03-18 DIAGNOSIS — L309 Dermatitis, unspecified: Secondary | ICD-10-CM | POA: Diagnosis not present

## 2021-03-18 DIAGNOSIS — Z6836 Body mass index (BMI) 36.0-36.9, adult: Secondary | ICD-10-CM | POA: Diagnosis not present

## 2021-03-18 DIAGNOSIS — Z130 Encounter for screening for diseases of the blood and blood-forming organs and certain disorders involving the immune mechanism: Secondary | ICD-10-CM

## 2021-03-18 DIAGNOSIS — Z1329 Encounter for screening for other suspected endocrine disorder: Secondary | ICD-10-CM

## 2021-03-18 DIAGNOSIS — Z13228 Encounter for screening for other metabolic disorders: Secondary | ICD-10-CM

## 2021-03-18 DIAGNOSIS — Z1321 Encounter for screening for nutritional disorder: Secondary | ICD-10-CM

## 2021-03-18 DIAGNOSIS — J4520 Mild intermittent asthma, uncomplicated: Secondary | ICD-10-CM

## 2021-03-18 LAB — POCT GLYCOSYLATED HEMOGLOBIN (HGB A1C): Hemoglobin A1C: 8.3 % — AB (ref 4.0–5.6)

## 2021-03-18 MED ORDER — TRIAMCINOLONE ACETONIDE 0.1 % EX OINT: 1.0000 "application " | TOPICAL_OINTMENT | Freq: Two times a day (BID) | CUTANEOUS | 1 refills | Status: DC

## 2021-03-18 NOTE — Progress Notes (Signed)
Established patient visit   Patient: Miguel Kent   DOB: [birth year]   31 y.o. Male  MRN: [id number] Visit Date: 03/18/2021   Chief Complaint  Patient presents with   Follow-up   Diabetes   Subjective    HPI  The patient presets for routine follow up of diabetes. His HgbA1c is up to 8.3 from 8.1 at last check. Was to start Jardiance 59m daily. States that he has not started this as he heard that this medication could cause heart failure. Discussed the indications for use of Jardiance with the patient. States that jardiance actually helps to prevent heart failure, especially In diabetic patients. This will also help him to control blood sugars and maybe lose some weight. We also reviewed the risks of have uncontrolled blood sugars for extended time periods. This includes heart failure, cardiovascular problems, heart failure, and kidney disease. He states that he will begin taking the Jardiance. He has filled and picked up the medication. States that he will also monitor his blood sugars, at least once daily, prior to eating or two to three hours after eating.  Now seeing new ENT provider. Diagnosed with deviated septum. Plans to surgically repair the deviated septum. Feels like this repair may resolve issues with sleep apnea. A date for surgery has not been planned yet.  Does need to have a larger container of ointment which he uses to treat chronic and widespread eczema.    Medications: Outpatient Medications Prior to Visit  Medication Sig   albuterol (PROVENTIL) (2.5 MG/3ML) 0.083% nebulizer solution Take 3 mLs (2.5 mg total) by nebulization every 6 (six) hours as needed for wheezing or shortness of breath.   albuterol (VENTOLIN HFA) 108 (90 Base) MCG/ACT inhaler Use 2 inhalations QID prn wheezing, SOB   atenolol-chlorthalidone (TENORETIC) 100-25 MG tablet Take 1 tablet by mouth daily.   Azelastine HCl 137 MCG/SPRAY SOLN USE 1 SPRAY(S) IN EACH NOSTRIL TWICE DAILY AS DIRECTED FOR 7 DAYS    empagliflozin (JARDIANCE) 10 MG TABS tablet Take 1 tablet (10 mg total) by mouth daily before breakfast.   EPINEPHrine 0.3 mg/0.3 mL IJ SOAJ injection Inject 0.3 mg into the muscle as needed for anaphylaxis.   Fluticasone-Salmeterol (ADVAIR) 100-50 MCG/DOSE AEPB 2 (two) times daily.   [DISCONTINUED] triamcinolone ointment (KENALOG) 0.5 % Apply 1 application topically 2 (two) times daily.   No facility-administered medications prior to visit.    Review of Systems  Constitutional:  Positive for fatigue. Negative for activity change, chills and fever.  HENT:  Negative for congestion, postnasal drip, rhinorrhea, sinus pressure, sinus pain, sneezing and sore throat.   Eyes: Negative.   Respiratory:  Negative for cough, shortness of breath and wheezing.   Cardiovascular:  Negative for chest pain and palpitations.  Gastrointestinal:  Negative for constipation, diarrhea, nausea and vomiting.  Endocrine: Negative for cold intolerance, heat intolerance, polydipsia and polyuria.       Uncontrolled blood sugars.   Genitourinary:  Negative for dysuria, frequency and urgency.  Musculoskeletal:  Negative for back pain and myalgias.  Skin:  Negative for rash.       Dry, rough patches of skin in multiple areas of his body.   Allergic/Immunologic: Positive for environmental allergies.  Neurological:  Negative for dizziness, weakness and headaches.  Psychiatric/Behavioral:  The patient is not nervous/anxious.      Objective     Today's Vitals   03/18/21 1122  BP: 128/64  Pulse: 89  Temp: 98.4 F (36.9 C)  SpO2: 95%  Weight: 267 lb 6.4 oz (121.3 kg)  Height: 6' (1.829 m)   Body mass index is 36.27 kg/m.   BP Readings from Last 3 Encounters:  03/18/21 128/64  02/15/21 (!) 149/105  12/30/20 117/74    Wt Readings from Last 3 Encounters:  03/18/21 267 lb 6.4 oz (121.3 kg)  02/15/21 268 lb (121.6 kg)  12/30/20 267 lb 1.6 oz (121.2 kg)    Physical Exam Vitals and nursing note reviewed.   Constitutional:      Appearance: Normal appearance. He is well-developed. He is obese.  HENT:     Head: Normocephalic and atraumatic.     Mouth/Throat:     Mouth: Mucous membranes are moist.     Pharynx: Oropharynx is clear.  Eyes:     Extraocular Movements: Extraocular movements intact.     Conjunctiva/sclera: Conjunctivae normal.     Pupils: Pupils are equal, round, and reactive to light.  Cardiovascular:     Rate and Rhythm: Normal rate and regular rhythm.     Pulses: Normal pulses.     Heart sounds: Normal heart sounds.  Pulmonary:     Effort: Pulmonary effort is normal.     Breath sounds: Normal breath sounds.  Abdominal:     Palpations: Abdomen is soft.  Musculoskeletal:        General: Normal range of motion.     Cervical back: Normal range of motion and neck supple.  Lymphadenopathy:     Cervical: No cervical adenopathy.  Skin:    General: Skin is warm and dry.     Capillary Refill: Capillary refill takes less than 2 seconds.     Comments: Several patches of rough and scaly skin. This is most noticeable on the arms.   Neurological:     General: No focal deficit present.     Mental Status: He is alert and oriented to person, place, and time.  Psychiatric:        Mood and Affect: Mood normal.        Behavior: Behavior normal.        Thought Content: Thought content normal.        Judgment: Judgment normal.     Results for orders placed or performed in visit on 03/18/21  POCT glycosylated hemoglobin (Hb A1C)  Result Value Ref Range   Hemoglobin A1C 8.3 (A) 4.0 - 5.6 %   HbA1c POC (<> result, manual entry)     HbA1c, POC (prediabetic range)     HbA1c, POC (controlled diabetic range)      Assessment & Plan    1. Type 2 diabetes mellitus without complication, without long-term current use of insulin (HCC) HgbA1c 8.3 today which is higher than previous check which was 8.1. long discussion about risks vs benefits of taking Jardiance and having long-term,  uncontrolled type 2 diabetes. Patient voiced understanding and stated he would start taking the jardiance tomorrow. Will recheck HgbA1c in three months.  - POCT glycosylated hemoglobin (Hb A1C)  2. Primary hypertension Stable. Continue bp medication as prescribed   3. Eczema, unspecified type Reorder triamcinolone ointment at 0.1% ointment. Use twice daily as needed. Ordered in jar rather than tube.  - triamcinolone ointment (KENALOG) 0.1 %; Apply 1 application topically 2 (two) times daily.  Dispense: 450 g; Refill: 1  4. Body mass index (BMI) of 36.0-36.9 in adult Encourage patient to limit calorie intake to 2000 cal/day or less.  He should consume a low cholesterol, low-fat diet.  Recommended adding exercise  into his daily routine.   5. Mild intermittent asthma, unspecified whether complicated Stable. Continue to use rescue inhaler as needed and as prescribed   6. Deviated septum Now seeing ENT provider/surgeon. Planning to have surgery to correct deviated septum.   7. Screening for endocrine, nutritional, metabolic and immunity disorder Routine, fasting labs ordered during today's visit.  - CBC; Future - Comp Met (CMET); Future - Lipid panel; Future - TSH; Future - Hemoglobin A1c; Future  8. Healthcare maintenance Routine, fasting labs ordered during today's visit.  - CBC; Future - Comp Met (CMET); Future - Lipid panel; Future - TSH; Future - Hemoglobin A1c; Future    Problem List Items Addressed This Visit       Cardiovascular and Mediastinum   Primary hypertension     Respiratory   Mild intermittent asthma   Deviated septum     Endocrine   Type 2 diabetes mellitus without complication, without long-term current use of insulin (HCC) - Primary   Relevant Orders   POCT glycosylated hemoglobin (Hb A1C) (Completed)     Musculoskeletal and Integument   Eczema   Relevant Medications   triamcinolone ointment (KENALOG) 0.1 %     Other   Body mass index (BMI) of  36.0-36.9 in adult   Other Visit Diagnoses     Screening for endocrine, nutritional, metabolic and immunity disorder       Relevant Orders   CBC   Comp Met (CMET)   Lipid panel   TSH   Hemoglobin A1c   Healthcare maintenance       Relevant Orders   CBC   Comp Met (CMET)   Lipid panel   TSH   Hemoglobin A1c        Return in about 3 months (around 06/15/2021) for diabetes with HgbA1c check, FBW at time of visit - will need to lay down while having bloodwork .         Ronnell Freshwater, NP  [facility] at [facility] [phone] (phone) [phone] (fax)  [place]

## 2021-03-22 DIAGNOSIS — L309 Dermatitis, unspecified: Secondary | ICD-10-CM | POA: Insufficient documentation

## 2021-03-22 DIAGNOSIS — I152 Hypertension secondary to endocrine disorders: Secondary | ICD-10-CM | POA: Insufficient documentation

## 2021-03-22 DIAGNOSIS — I10 Essential (primary) hypertension: Secondary | ICD-10-CM | POA: Insufficient documentation

## 2021-03-22 DIAGNOSIS — J342 Deviated nasal septum: Secondary | ICD-10-CM | POA: Insufficient documentation

## 2021-03-22 NOTE — Patient Instructions (Signed)
Fat and Cholesterol Restricted Eating Plan Getting too much fat and cholesterol in your diet may cause health problems. Choosing the right foods helps keep your fat and cholesterol at normal levels. This can keep you from getting certain diseases. Your doctor may recommend an eating plan that includes: Total fat: ______% or less of total calories a day. This is ______g of fat a day. Saturated fat: ______% or less of total calories a day. This is ______g of saturated fat a day. Cholesterol: less than _________mg a day. Fiber: ______g a day. What are tips for following this plan? General tips Work with your doctor to lose weight if you need to. Avoid: Foods with added sugar. Fried foods. Foods with trans fat or partially hydrogenated oils. This includes some margarines and baked goods. If you drink alcohol: Limit how much you have to: 0-1 drink a day for women who are not pregnant. 0-2 drinks a day for men. Know how much alcohol is in a drink. In the [place] one drink equals one 12 oz bottle of beer (355 mL), one 5 oz glass of wine (148 mL), or one 1 oz glass of hard liquor (44 mL). Reading food labels Check food labels for: Trans fats. Partially hydrogenated oils. Saturated fat (g) in each serving. Cholesterol (mg) in each serving. Fiber (g) in each serving. Choose foods with healthy fats, such as: Monounsaturated fats and polyunsaturated fats. These include olive and canola oil, flaxseeds, walnuts, almonds, and seeds. Omega-3 fats. These are found in certain fish, flaxseed oil, and ground flaxseeds. Choose grain products that have whole grains. Look for the word "whole" as the first word in the ingredient list. Cooking Cook foods using low-fat methods. These include baking, boiling, grilling, and broiling. Eat more home-cooked foods. Eat at restaurants and buffets less often. Eat less fast food. Avoid cooking using saturated fats, such as butter, cream, palm oil, palm kernel oil, and  coconut oil. Meal planning  At meals, divide your plate into four equal parts: Fill one-half of your plate with vegetables, green salads, and fruit. Fill one-fourth of your plate with whole grains. Fill one-fourth of your plate with low-fat (lean) protein foods. Eat fish that is high in omega-3 fats at least two times a week. This includes mackerel, tuna, sardines, and salmon. Eat foods that are high in fiber, such as whole grains, beans, apples, pears, berries, broccoli, carrots, peas, and barley. What foods should I eat? Fruits All fresh, canned (in natural juice), or frozen fruits. Vegetables Fresh or frozen vegetables (raw, steamed, roasted, or grilled). Green salads. Grains Whole grains, such as whole wheat or whole grain breads, crackers, cereals, and pasta. Unsweetened oatmeal, bulgur, barley, quinoa, or brown rice. Corn or whole wheat flour tortillas. Meats and other protein foods Ground beef (85% or leaner), grass-fed beef, or beef trimmed of fat. Skinless chicken or turkey. Ground chicken or turkey. Pork trimmed of fat. All fish and seafood. Egg whites. Dried beans, peas, or lentils. Unsalted nuts or seeds. Unsalted canned beans. Nut butters without added sugar or oil. Dairy Low-fat or nonfat dairy products, such as skim or 1% milk, 2% or reduced-fat cheeses, low-fat and fat-free ricotta or cottage cheese, or plain low-fat and nonfat yogurt. Fats and oils Tub margarine without trans fats. Light or reduced-fat mayonnaise and salad dressings. Avocado. Olive, canola, sesame, or safflower oils. The items listed above may not be a complete list of foods and beverages you can eat. Contact a dietitian for more information. What foods   should I avoid? Fruits Canned fruit in heavy syrup. Fruit in cream or butter sauce. Fried fruit. Vegetables Vegetables cooked in cheese, cream, or butter sauce. Fried vegetables. Grains White bread. White pasta. White rice. Cornbread. Bagels, pastries,  and croissants. Crackers and snack foods that contain trans fat and hydrogenated oils. Meats and other protein foods Fatty cuts of meat. Ribs, chicken wings, bacon, sausage, bologna, salami, chitterlings, fatback, hot dogs, bratwurst, and packaged lunch meats. Liver and organ meats. Whole eggs and egg yolks. Chicken and turkey with skin. Fried meat. Dairy Whole or 2% milk, cream, half-and-half, and cream cheese. Whole milk cheeses. Whole-fat or sweetened yogurt. Full-fat cheeses. Nondairy creamers and whipped toppings. Processed cheese, cheese spreads, and cheese curds. Fats and oils Butter, stick margarine, lard, shortening, ghee, or bacon fat. Coconut, palm kernel, and palm oils. Beverages Alcohol. Sugar-sweetened drinks such as sodas, lemonade, and fruit drinks. Sweets and desserts Corn syrup, sugars, honey, and molasses. Candy. Jam and jelly. Syrup. Sweetened cereals. Cookies, pies, cakes, donuts, muffins, and ice cream. The items listed above may not be a complete list of foods and beverages you should avoid. Contact a dietitian for more information. Summary Choosing the right foods helps keep your fat and cholesterol at normal levels. This can keep you from getting certain diseases. At meals, fill one-half of your plate with vegetables, green salads, and fruits. Eat high fiber foods, like whole grains, beans, apples, pears, berries, carrots, peas, and barley. Limit added sugar, saturated fats, alcohol, and fried foods. This information is not intended to replace advice given to you by your health care provider. Make sure you discuss any questions you have with your health care provider. Document Revised: 05/21/2020 Document Reviewed: 05/21/2020 Elsevier Patient Education  2022 Elsevier Inc.  

## 2021-03-30 ENCOUNTER — Ambulatory Visit: Payer: BC Managed Care – PPO | Admitting: Nurse Practitioner

## 2021-05-20 ENCOUNTER — Telehealth: Payer: Self-pay | Admitting: Nurse Practitioner

## 2021-05-20 DIAGNOSIS — J4520 Mild intermittent asthma, uncomplicated: Secondary | ICD-10-CM

## 2021-05-20 MED ORDER — ALBUTEROL SULFATE HFA 108 (90 BASE) MCG/ACT IN AERS: INHALATION_SPRAY | RESPIRATORY_TRACT | 3 refills | Status: DC

## 2021-05-20 NOTE — Telephone Encounter (Signed)
Albuterol HFA refill sent to pharmacy. AS, CMA ?

## 2021-05-20 NOTE — Telephone Encounter (Signed)
Patient is aware 

## 2021-05-20 NOTE — Telephone Encounter (Signed)
Patient is requesting a refill of his inhaler. He does have an upcoming appointment late May. Please advise.  ?

## 2021-05-31 ENCOUNTER — Ambulatory Visit (INDEPENDENT_AMBULATORY_CARE_PROVIDER_SITE_OTHER): Payer: Self-pay

## 2021-05-31 ENCOUNTER — Ambulatory Visit
Admission: EM | Admit: 2021-05-31 | Discharge: 2021-05-31 | Disposition: A | Discharge status: Home / Self Care | Payer: Self-pay | Attending: Physician Assistant | Admitting: Physician Assistant

## 2021-05-31 DIAGNOSIS — R059 Cough, unspecified: Secondary | ICD-10-CM

## 2021-05-31 DIAGNOSIS — J019 Acute sinusitis, unspecified: Secondary | ICD-10-CM | POA: Insufficient documentation

## 2021-05-31 DIAGNOSIS — R0981 Nasal congestion: Secondary | ICD-10-CM

## 2021-05-31 DIAGNOSIS — R509 Fever, unspecified: Secondary | ICD-10-CM | POA: Insufficient documentation

## 2021-05-31 DIAGNOSIS — R051 Acute cough: Secondary | ICD-10-CM | POA: Insufficient documentation

## 2021-05-31 DIAGNOSIS — E669 Obesity, unspecified: Secondary | ICD-10-CM | POA: Insufficient documentation

## 2021-05-31 DIAGNOSIS — E119 Type 2 diabetes mellitus without complications: Secondary | ICD-10-CM | POA: Insufficient documentation

## 2021-05-31 DIAGNOSIS — Z6836 Body mass index (BMI) 36.0-36.9, adult: Secondary | ICD-10-CM | POA: Insufficient documentation

## 2021-05-31 DIAGNOSIS — G4733 Obstructive sleep apnea (adult) (pediatric): Secondary | ICD-10-CM | POA: Insufficient documentation

## 2021-05-31 DIAGNOSIS — Z20822 Contact with and (suspected) exposure to covid-19: Secondary | ICD-10-CM | POA: Insufficient documentation

## 2021-05-31 LAB — RESP PANEL BY RT-PCR (FLU A&B, COVID) ARPGX2
Influenza A by PCR: NEGATIVE
Influenza B by PCR: NEGATIVE
SARS Coronavirus 2 by RT PCR: NEGATIVE

## 2021-05-31 MED ORDER — AMOXICILLIN-POT CLAVULANATE 250-62.5 MG/5ML PO SUSR: 875.0000 mg | Freq: Two times a day (BID) | ORAL | 0 refills | Status: AC

## 2021-05-31 MED ORDER — ACETAMINOPHEN 500 MG PO TABS
1000.0000 mg | ORAL_TABLET | Freq: Once | ORAL | Status: AC
  Administered 2021-05-31: 1000 mg via ORAL

## 2021-05-31 NOTE — ED Triage Notes (Signed)
Pt c/o possible sinus infection.  ? ?Pt is having cough with yellow/green phlegm, fatigue, body chills x4days. ? ?Pt would like to be tested for covid or flu.  ? ?

## 2021-05-31 NOTE — ED Provider Notes (Signed)
?[facility] ? ? ? ?CSN: [id number] ?Arrival date & time: 05/31/21  1614 ? ? ?  ? ?History   ?Chief Complaint ?Chief Complaint  ?Patient presents with  ? Cough  ? ? ?HPI ?Miguel Kent is a 31 y.o. male presenting for 4-day history of fever, fatigue, body aches, cough, nasal congestion.  Reports that he had a sore throat but it resolved.  Also reports that he had some wheezing but does not really feel short of breath.  Patient reports he is very bothered by his swollen nasal turbinates.  Reports a history of sinus problems.  Patient also has history of asthma.  He denies any sick contacts.  Patient partner is in the room.  She states she recently had a cough and went to the ER yesterday to get checked out and her COVID test was negative.  Patient has been taking OTC cough medications such as Coricidin HBP and using his nasal sprays without any real improvement in his symptoms.  Other medical history significant for type 2 diabetes, obstructive sleep apnea, obesity, eczema. ? ?HPI ? ?Past Medical History:  ?Diagnosis Date  ? Asthma   ? Hypertension   ? Pre-diabetes   ? ? ?Patient Active Problem List  ? Diagnosis Date Noted  ? Primary hypertension 03/22/2021  ? Eczema 03/22/2021  ? Deviated septum 03/22/2021  ? Dizziness 02/15/2021  ? Blurred vision 02/15/2021  ? Type 2 diabetes mellitus without complication, without long-term current use of insulin (HCC) 01/02/2021  ? Balance disorder 01/02/2021  ? Central nervous system disorder 01/02/2021  ? Chronic nonintractable headache 01/02/2021  ? Body mass index (BMI) of 36.0-36.9 in adult 01/02/2021  ? Encounter to establish care 10/30/2020  ? Acute recurrent pansinusitis 10/30/2020  ? Mild intermittent asthma 10/30/2020  ? Obstructive sleep apnea hypopnea, severe 10/30/2020  ? Health care maintenance 10/30/2020  ? ? ?Past Surgical History:  ?Procedure Laterality Date  ? TONSILLECTOMY    ? TYMPANOSTOMY TUBE PLACEMENT    ? ? ? ? ? ?Home Medications   ? ?Prior to  Admission medications   ?Medication Sig Start Date End Date Taking? Authorizing Provider  ?albuterol (PROVENTIL) (2.5 MG/3ML) 0.083% nebulizer solution Take 3 mLs (2.5 mg total) by nebulization every 6 (six) hours as needed for wheezing or shortness of breath. 10/11/20  Yes Carlean Jews, NP  ?albuterol (VENTOLIN HFA) 108 (90 Base) MCG/ACT inhaler Use 2 inhalations QID prn wheezing, SOB 05/20/21  Yes Boscia, Heather E, NP  ?amoxicillin-clavulanate (AUGMENTIN) 250-62.5 MG/5ML suspension Take 17.5 mLs (875 mg total) by mouth 2 (two) times daily for 7 days. 05/31/21 06/07/21 Yes Shirlee Latch, PA-C  ?atenolol-chlorthalidone (TENORETIC) 100-25 MG tablet Take 1 tablet by mouth daily. 03/08/21  Yes Carlean Jews, NP  ?Azelastine HCl 137 MCG/SPRAY SOLN USE 1 SPRAY(S) IN EACH NOSTRIL TWICE DAILY AS DIRECTED FOR 7 DAYS 08/23/20  Yes Abernathy, Arlyss Repress, NP  ?empagliflozin (JARDIANCE) 10 MG TABS tablet Take 1 tablet (10 mg total) by mouth daily before breakfast. 12/30/20  Yes Boscia, Heather E, NP  ?EPINEPHrine 0.3 mg/0.3 mL IJ SOAJ injection Inject 0.3 mg into the muscle as needed for anaphylaxis. 06/03/20  Yes Sallyanne Kuster, NP  ?Fluticasone-Salmeterol (ADVAIR) 100-50 MCG/DOSE AEPB 2 (two) times daily. 03/28/20  Yes [provider]  ?triamcinolone ointment (KENALOG) 0.1 % Apply 1 application topically 2 (two) times daily. 03/18/21  Yes Carlean Jews, NP  ? ? ?Family History ?Family History  ?Problem Relation Age of Onset  ? Healthy  Mother   ? Hypertension Father   ? ? ?Social History ?Social History  ? ?Tobacco Use  ? Smoking status: Never  ? Smokeless tobacco: Never  ?Vaping Use  ? Vaping Use: Never used  ?Substance Use Topics  ? Alcohol use: Never  ? Drug use: Never  ? ? ? ?Allergies   ?Peanut oil and Peanut-containing drug products ? ? ?Review of Systems ?Review of Systems  ?Constitutional:  Positive for fatigue and fever.  ?HENT:  Positive for congestion and rhinorrhea. Negative for sinus pressure, sinus  pain and sore throat.   ?Respiratory:  Positive for cough and wheezing. Negative for shortness of breath.   ?Cardiovascular:  Negative for chest pain.  ?Gastrointestinal:  Negative for abdominal pain, diarrhea, nausea and vomiting.  ?Musculoskeletal:  Positive for myalgias.  ?Neurological:  Negative for weakness, light-headedness and headaches.  ?Hematological:  Negative for adenopathy.  ? ? ?Physical Exam ?Triage Vital Signs ?ED Triage Vitals  ?Enc Vitals Group  ?   BP 05/31/21 1632 (!) 152/108  ?   Pulse Rate 05/31/21 1632 95  ?   Resp 05/31/21 1632 18  ?   Temp 05/31/21 1632 (!) 101.5 ?F (38.6 ?C)  ?   Temp Source 05/31/21 1632 Oral  ?   SpO2 05/31/21 1632 96 %  ?   Weight 05/31/21 1630 270 lb (122.5 kg)  ?   Height 05/31/21 1630 6' (1.829 m)  ?   Head Circumference --   ?   Peak Flow --   ?   Pain Score 05/31/21 1630 8  ?   Pain Loc --   ?   Pain Edu? --   ?   Excl. in GC? --   ? ?No data found. ? ?Updated Vital Signs ?BP (!) 152/108 (BP Location: Left Arm)   Pulse 95   Temp (!) 101.5 ?F (38.6 ?C) (Oral)   Resp 18   Ht 6' (1.829 m)   Wt 270 lb (122.5 kg)   SpO2 96%   BMI 36.62 kg/m?    ? ?Physical Exam ?Vitals and nursing note reviewed.  ?Constitutional:   ?   General: He is not in acute distress. ?   Appearance: Normal appearance. He is well-developed. He is obese. He is ill-appearing.  ?HENT:  ?   Head: Normocephalic and atraumatic.  ?   Nose: Congestion present.  ?   Mouth/Throat:  ?   Mouth: Mucous membranes are moist.  ?   Pharynx: Oropharynx is clear.  ?Eyes:  ?   General: No scleral icterus. ?   Conjunctiva/sclera: Conjunctivae normal.  ?Cardiovascular:  ?   Rate and Rhythm: Normal rate and regular rhythm.  ?Pulmonary:  ?   Effort: Pulmonary effort is normal. No respiratory distress.  ?   Breath sounds: Normal breath sounds.  ?Musculoskeletal:  ?   Cervical back: Neck supple.  ?Skin: ?   General: Skin is warm and dry.  ?   Capillary Refill: Capillary refill takes less than 2 seconds.   ?Neurological:  ?   General: No focal deficit present.  ?   Mental Status: He is alert. Mental status is at baseline.  ?   Motor: No weakness.  ?   Coordination: Coordination normal.  ?   Gait: Gait normal.  ?Psychiatric:     ?   Mood and Affect: Mood normal.     ?   Behavior: Behavior normal.     ?   Thought Content: Thought content normal.  ? ? ? ?  UC Treatments / Results  ?Labs ?(all labs ordered are listed, but only abnormal results are displayed) ?Labs Reviewed  ?RESP PANEL BY RT-PCR (FLU A&B, COVID) [id number]  ? ? ?EKG ? ? ?Radiology ?DG Chest 2 View ? ?Result Date: 05/31/2021 ?CLINICAL DATA:  Fever and cough EXAM: CHEST - 2 VIEW COMPARISON:  None Available. FINDINGS: The heart size and mediastinal contours are within normal limits. Both lungs are clear. The visualized skeletal structures are unremarkable. IMPRESSION: No active cardiopulmonary disease. Electronically Signed   By: Judie PetitM.  Shick M.D.   On: 05/31/2021 17:39   ? ?Procedures ?Procedures (including critical care time) ? ?Medications Ordered in UC ?Medications  ?acetaminophen (TYLENOL) tablet 1,000 mg (1,000 mg Oral Given 05/31/21 1730)  ? ? ?Initial Impression / Assessment and Plan / UC Course  ?I have reviewed the triage vital signs and the nursing notes. ? ?Pertinent labs & imaging results that were available during my care of the patient were reviewed by me and considered in my medical decision making (see chart for details). ? ?31 year old male presenting for fever, fatigue, body aches, cough, congestion.  Symptom onset 4 days ago.  In clinic patient has temp of 101.5 degrees.  He has significant nasal congestion and swollen nasal mucosa/turbinates.  Chest clear to auscultation. ? ?Patient given Tylenol for fever. ? ?Respiratory panel obtained as well as chest x-ray ordered.  Negative respiratory panel and normal chest x-ray.  Discussed results with patient. ? ?We will treat patient for possible bacterial sinusitis given his history.  Sent Augmentin to  pharmacy.  He asked for it in liquid form.  Also advised Mucinex and Flonase.  Reviewed return and ER precautions. ? ? ?Final Clinical Impressions(s) / UC Diagnoses  ? ?Final diagnoses:  ?Acute sinusitis, recurrence not specified

## 2021-05-31 NOTE — Discharge Instructions (Signed)
-  Negative flu and COVID ?- Chest x-ray is normal.  No evidence of pneumonia. ?- I suspect you may have another virus, a flulike virus which should run its course over the next several days, however given your history of sinus infections I am going to cover you with an antibiotic. ?-Also take Mucinex D and use Flonase daily.  Tylenol Motrin for fever. ?

## 2021-06-17 ENCOUNTER — Ambulatory Visit: Payer: BC Managed Care – PPO | Admitting: Nurse Practitioner

## 2021-07-29 ENCOUNTER — Ambulatory Visit: Payer: BC Managed Care – PPO | Admitting: Nurse Practitioner

## 2021-08-14 ENCOUNTER — Emergency Department: Payer: Self-pay

## 2021-08-14 ENCOUNTER — Other Ambulatory Visit: Payer: Self-pay

## 2021-08-14 ENCOUNTER — Emergency Department
Admission: EM | Admit: 2021-08-14 | Discharge: 2021-08-14 | Disposition: A | Discharge status: Home / Self Care | Payer: Self-pay | Attending: Emergency Medicine | Admitting: Emergency Medicine

## 2021-08-14 DIAGNOSIS — R42 Dizziness and giddiness: Secondary | ICD-10-CM

## 2021-08-14 DIAGNOSIS — E860 Dehydration: Secondary | ICD-10-CM | POA: Insufficient documentation

## 2021-08-14 DIAGNOSIS — H6123 Impacted cerumen, bilateral: Secondary | ICD-10-CM | POA: Insufficient documentation

## 2021-08-14 DIAGNOSIS — E876 Hypokalemia: Secondary | ICD-10-CM | POA: Insufficient documentation

## 2021-08-14 LAB — CBC
HCT: 52.5 % — ABNORMAL HIGH (ref 39.0–52.0)
Hemoglobin: 17.6 g/dL — ABNORMAL HIGH (ref 13.0–17.0)
MCH: 28.2 pg (ref 26.0–34.0)
MCHC: 33.5 g/dL (ref 30.0–36.0)
MCV: 84.1 fL (ref 80.0–100.0)
Platelets: 305 10*3/uL (ref 150–400)
RBC: 6.24 MIL/uL — ABNORMAL HIGH (ref 4.22–5.81)
RDW: 13.2 % (ref 11.5–15.5)
WBC: 9.4 10*3/uL (ref 4.0–10.5)
nRBC: 0 % (ref 0.0–0.2)

## 2021-08-14 LAB — BASIC METABOLIC PANEL
Anion gap: 12 (ref 5–15)
BUN: 21 mg/dL — ABNORMAL HIGH (ref 6–20)
CO2: 28 mmol/L (ref 22–32)
Calcium: 9.7 mg/dL (ref 8.9–10.3)
Chloride: 97 mmol/L — ABNORMAL LOW (ref 98–111)
Creatinine, Ser: 1.14 mg/dL (ref 0.61–1.24)
GFR, Estimated: >60 mL/min (ref >60)
Glucose, Bld: 168 mg/dL — ABNORMAL HIGH (ref 70–99)
Potassium: 2.7 mmol/L — CL (ref 3.5–5.1)
Sodium: 137 mmol/L (ref 135–145)

## 2021-08-14 LAB — TROPONIN I (HIGH SENSITIVITY)
Troponin I (High Sensitivity): 7 ng/L (ref <18)
Troponin I (High Sensitivity): 7 ng/L (ref <18)

## 2021-08-14 MED ORDER — MECLIZINE HCL 25 MG PO TABS: 25.0000 mg | ORAL_TABLET | Freq: Three times a day (TID) | ORAL | 0 refills | Status: DC | PRN

## 2021-08-14 MED ORDER — CARBAMIDE PEROXIDE 6.5 % OT SOLN: 5.0000 [drp] | Freq: Two times a day (BID) | OTIC | 2 refills | Status: AC

## 2021-08-14 MED ORDER — CARBAMIDE PEROXIDE 6.5 % OT SOLN: 5.0000 [drp] | Freq: Two times a day (BID) | OTIC | 2 refills | Status: DC

## 2021-08-14 MED ORDER — POTASSIUM CHLORIDE 20 MEQ PO PACK
40.0000 meq | PACK | Freq: Once | ORAL | Status: AC
Start: 2021-08-14 — End: 2021-08-14
  Administered 2021-08-14: 40 meq via ORAL
  Filled 2021-08-14: qty 2

## 2021-08-14 NOTE — ED Provider Notes (Signed)
[facility] Provider Note    Event Date/Time   First MD Initiated Contact with Patient 08/14/21 [phone]     (approximate)   History   Chief Complaint: Dizziness   HPI  Miguel Kent is a 31 y.o. male who complains of feeling off balance for the past 2 or 3 days.  Several episodes of dizziness with standing.  Also notes that the dizziness is worse with head turning.  Feels like room is spinning.  No significant chest pain or shortness of breath, no fever, no neck pain, no trauma.  No acute vision changes, paresthesia or weakness.     Physical Exam   Triage Vital Signs: ED Triage Vitals  Enc Vitals Group     BP 08/14/21 0138 (!) 171/78     Pulse Rate 08/14/21 0138 84     Resp 08/14/21 0138 18     Temp 08/14/21 0138 98.4 F (36.9 C)     Temp Source 08/14/21 0138 Oral     SpO2 08/14/21 0138 100 %     Weight 08/14/21 0139 270 lb (122.5 kg)     Height 08/14/21 0139 6' (1.829 m)     Head Circumference --      Peak Flow --      Pain Score --      Pain Loc --      Pain Edu? --      Excl. in GC? --     Most recent vital signs: Vitals:   08/14/21 0138 08/14/21 0423  BP: (!) 171/78 (!) 157/98  Pulse: 84 79  Resp: 18 17  Temp: 98.4 F (36.9 C) 98.2 F (36.8 C)  SpO2: 100% 99%    General: Awake, no distress.  CV:  Good peripheral perfusion.  Regular rate and rhythm Resp:  Normal effort.  Abd:  No distention.  Other:  Cranial nerves III through XII intact.  Normal gait.  External ear canals impacted with dried cerumen bilaterally.   ED Results / Procedures / Treatments   Labs (all labs ordered are listed, but only abnormal results are displayed) Labs Reviewed  BASIC METABOLIC PANEL - Abnormal; Notable for the following components:      Result Value   Potassium 2.7 (*)    Chloride 97 (*)    Glucose, Bld 168 (*)    BUN 21 (*)    All other components within normal limits  CBC - Abnormal; Notable for the following components:   RBC 6.24  (*)    Hemoglobin 17.6 (*)    HCT 52.5 (*)    All other components within normal limits  TROPONIN I (HIGH SENSITIVITY)  TROPONIN I (HIGH SENSITIVITY)     EKG Interpreted by me Sinus rhythm rate of 86.  Left axis, left bundle branch block.  Inferior T wave inversions.  No acute ischemic changes   RADIOLOGY Chest x-ray interpreted by me, appears normal.  Radiology report reviewed   PROCEDURES:  Procedures   MEDICATIONS ORDERED IN ED: Medications  potassium chloride (KLOR-CON) packet 40 mEq (40 mEq Oral Given 08/14/21 0432)     IMPRESSION / MDM / ASSESSMENT AND PLAN / ED COURSE  I reviewed the triage vital signs and the nursing notes.                              Differential diagnosis includes, but is not limited to, dehydration, electrolyte abnormality, peripheral vertigo, anemia, renal insufficiency  Patient's presentation is most consistent with acute presentation with potential threat to life or bodily function.  Patient comes to the ED complaining of dizziness, worse with standing and head turning.  Exam is neurologically intact.  He does have cerumen impaction in bilateral ears.  Labs reveal low potassium, hemoconcentration suggestive of dehydration.  Patient given potassium supplement, counseled patient on increasing water intake.  Will prescribe Debrox and meclizine.   Considering the patient's symptoms, medical history, and physical examination today, I have low suspicion for ischemic stroke, intracranial hemorrhage, meningitis, encephalitis, carotid or vertebral dissection, venous sinus thrombosis, MS, intracranial hypertension, glaucoma, CRAO, CRVO, or temporal arteritis.       FINAL CLINICAL IMPRESSION(S) / ED DIAGNOSES   Final diagnoses:  Dizziness  Dehydration  Bilateral impacted cerumen     Rx / DC Orders   ED Discharge Orders          Ordered    meclizine (ANTIVERT) 25 MG tablet  3 times daily PRN,   Status:  Discontinued        08/14/21  0629    carbamide peroxide (DEBROX) 6.5 % OTIC solution  2 times daily,   Status:  Discontinued        08/14/21 0629    carbamide peroxide (DEBROX) 6.5 % OTIC solution  2 times daily        08/14/21 0629    meclizine (ANTIVERT) 25 MG tablet  3 times daily PRN        08/14/21 3614             Note:  This document was prepared using Dragon voice recognition software and may include unintentional dictation errors.   Sharman Cheek, MD 08/14/21 [phone]

## 2021-08-14 NOTE — ED Triage Notes (Signed)
C/o feeling Dizziness and feeling off balance x 2-3 days with several near syncopal episodes. Endorses SOB. Hx of deviated septum and asthma.  Also c/o headache and head/neck/ear pain on left side. Denies fever, Denies chest pain.

## 2021-08-14 NOTE — ED Notes (Signed)
Pt states he feels like the room is spinning.

## 2021-08-18 ENCOUNTER — Telehealth: Payer: Self-pay | Admitting: Nurse Practitioner

## 2021-08-18 NOTE — Telephone Encounter (Signed)
Patient has been taking atenolol-hctz but recently seen in ED and advised to d/c fluid pill.   Requesting script for atenolol without the HCTZ.   Also asking for Amlodipine which I do not see on patients medication list.   Patients potassium was low. They are asking about a script for potassium.    [facility] on [address] in [place], [place]

## 2021-08-18 NOTE — Telephone Encounter (Signed)
Patient is aware of the below and verbalized understanding. Future appointment scheduled. AS, CMA

## 2021-08-22 NOTE — Telephone Encounter (Signed)
Thank you :)

## 2021-08-25 ENCOUNTER — Ambulatory Visit: Payer: Self-pay | Admitting: Nurse Practitioner

## 2021-10-07 ENCOUNTER — Ambulatory Visit: Payer: Self-pay | Admitting: Nurse Practitioner

## 2021-12-06 ENCOUNTER — Ambulatory Visit (INDEPENDENT_AMBULATORY_CARE_PROVIDER_SITE_OTHER): Payer: Self-pay | Admitting: Nurse Practitioner

## 2021-12-06 ENCOUNTER — Encounter: Payer: Self-pay | Admitting: Nurse Practitioner

## 2021-12-06 VITALS — BP 140/97 | HR 92 | Ht 72.0 in | Wt 276.1 lb

## 2021-12-06 DIAGNOSIS — I152 Hypertension secondary to endocrine disorders: Secondary | ICD-10-CM

## 2021-12-06 DIAGNOSIS — J4520 Mild intermittent asthma, uncomplicated: Secondary | ICD-10-CM

## 2021-12-06 DIAGNOSIS — E119 Type 2 diabetes mellitus without complications: Secondary | ICD-10-CM

## 2021-12-06 DIAGNOSIS — J0140 Acute pansinusitis, unspecified: Secondary | ICD-10-CM

## 2021-12-06 DIAGNOSIS — E1159 Type 2 diabetes mellitus with other circulatory complications: Secondary | ICD-10-CM

## 2021-12-06 LAB — POCT GLYCOSYLATED HEMOGLOBIN (HGB A1C): HbA1c POC (<> result, manual entry): 6.1 % (ref 4.0–5.6)

## 2021-12-06 MED ORDER — ALBUTEROL SULFATE (2.5 MG/3ML) 0.083% IN NEBU: 2.5000 mg | INHALATION_SOLUTION | Freq: Four times a day (QID) | RESPIRATORY_TRACT | 12 refills | Status: DC | PRN

## 2021-12-06 MED ORDER — FLUTICASONE-SALMETEROL 100-50 MCG/ACT IN AEPB: 1.0000 | INHALATION_SPRAY | Freq: Two times a day (BID) | RESPIRATORY_TRACT | 3 refills | Status: DC

## 2021-12-06 MED ORDER — ALBUTEROL SULFATE HFA 108 (90 BASE) MCG/ACT IN AERS: INHALATION_SPRAY | RESPIRATORY_TRACT | 3 refills | Status: DC

## 2021-12-06 MED ORDER — AMOXICILLIN-POT CLAVULANATE 250-62.5 MG/5ML PO SUSR: 15.0000 mL | Freq: Two times a day (BID) | ORAL | 0 refills | Status: AC

## 2021-12-06 NOTE — Progress Notes (Signed)
Established patient visit   Patient: Miguel Kent   DOB: 12/28/1990   31 y.o. Male  MRN: [id number] Visit Date: 12/06/2021   Chief Complaint  Patient presents with   Follow-up   Subjective    Patient was seen in ER a few months ago.  -saw family provider recommended by [facility].  -no longer on chlorthalidone.  -on atenolol  -on losartan.  No longer on jardiance  -now on glipizide   Sinus Problem This is a recurrent problem. The current episode started in the past 7 days. The problem has been waxing and waning since onset. There has been no fever. Associated symptoms include congestion, coughing, headaches and sinus pressure. Pertinent negatives include no chills, ear pain, shortness of breath, sneezing or sore throat. Treatments tried: antihistamine, inhaler - help for short period of time. The treatment provided mild relief.       Medications: Outpatient Medications Prior to Visit  Medication Sig   atenolol-chlorthalidone (TENORETIC) 100-25 MG tablet Take 1 tablet by mouth daily.   Azelastine HCl 137 MCG/SPRAY SOLN USE 1 SPRAY(S) IN EACH NOSTRIL TWICE DAILY AS DIRECTED FOR 7 DAYS   carbamide peroxide (DEBROX) 6.5 % OTIC solution Place 5 drops into the right ear 2 (two) times daily.   empagliflozin (JARDIANCE) 10 MG TABS tablet Take 1 tablet (10 mg total) by mouth daily before breakfast.   EPINEPHrine 0.3 mg/0.3 mL IJ SOAJ injection Inject 0.3 mg into the muscle as needed for anaphylaxis.   meclizine (ANTIVERT) 25 MG tablet Take 1 tablet (25 mg total) by mouth 3 (three) times daily as needed for dizziness or nausea.   triamcinolone ointment (KENALOG) 0.1 % Apply 1 application topically 2 (two) times daily.   [DISCONTINUED] albuterol (PROVENTIL) (2.5 MG/3ML) 0.083% nebulizer solution Take 3 mLs (2.5 mg total) by nebulization every 6 (six) hours as needed for wheezing or shortness of breath.   [DISCONTINUED] albuterol (VENTOLIN HFA) 108 (90 Base) MCG/ACT inhaler Use 2  inhalations QID prn wheezing, SOB   [DISCONTINUED] Fluticasone-Salmeterol (ADVAIR) 100-50 MCG/DOSE AEPB 2 (two) times daily.   No facility-administered medications prior to visit.    Review of Systems  Constitutional:  Positive for fatigue. Negative for activity change, chills and fever.  HENT:  Positive for congestion, postnasal drip, rhinorrhea, sinus pressure and sinus pain. Negative for ear pain, sneezing and sore throat.   Eyes: Negative.   Respiratory:  Positive for cough and wheezing. Negative for shortness of breath.   Cardiovascular:  Negative for chest pain and palpitations.  Gastrointestinal:  Negative for constipation, diarrhea, nausea and vomiting.  Endocrine: Negative for cold intolerance, heat intolerance, polydipsia and polyuria.       Has not been checking blood sugar   Genitourinary:  Negative for dysuria, frequency and urgency.  Musculoskeletal:  Negative for back pain and myalgias.  Skin:  Negative for rash.  Allergic/Immunologic: Positive for environmental allergies.  Neurological:  Positive for headaches. Negative for dizziness and weakness.  Psychiatric/Behavioral:  The patient is not nervous/anxious.      Objective     Today's Vitals   12/06/21 1622 12/06/21 1650  BP: (Abnormal) 150/100 (Abnormal) 140/97  Pulse: 92   SpO2: 99%   Weight: 276 lb 1.6 oz (125.2 kg)   Height: 6' (1.829 m)    Body mass index is 37.45 kg/m.  BP Readings from Last 3 Encounters:  12/06/21 (Abnormal) 140/97  08/14/21 (Abnormal) 138/99  05/31/21 (Abnormal) 152/108    Wt Readings from Last 3 Encounters:  12/06/21  276 lb 1.6 oz (125.2 kg)  08/14/21 270 lb (122.5 kg)  05/31/21 270 lb (122.5 kg)    Physical Exam Vitals and nursing note reviewed.  Constitutional:      Appearance: Normal appearance. He is well-developed.  HENT:     Head: Normocephalic and atraumatic.     Right Ear: Tympanic membrane is erythematous and bulging.     Left Ear: Tympanic membrane is  erythematous and bulging.     Nose: Congestion present.     Right Turbinates: Swollen.     Left Turbinates: Swollen.     Right Sinus: Maxillary sinus tenderness and frontal sinus tenderness present.     Left Sinus: Maxillary sinus tenderness and frontal sinus tenderness present.     Mouth/Throat:     Pharynx: Pharyngeal swelling present.  Eyes:     Pupils: Pupils are equal, round, and reactive to light.  Cardiovascular:     Rate and Rhythm: Normal rate and regular rhythm.     Pulses: Normal pulses.     Heart sounds: Normal heart sounds.  Pulmonary:     Effort: Pulmonary effort is normal.     Breath sounds: Normal breath sounds.  Abdominal:     Palpations: Abdomen is soft.  Musculoskeletal:        General: Normal range of motion.     Cervical back: Normal range of motion and neck supple.  Lymphadenopathy:     Cervical: No cervical adenopathy.  Skin:    General: Skin is warm and dry.     Capillary Refill: Capillary refill takes less than 2 seconds.  Neurological:     General: No focal deficit present.     Mental Status: He is alert and oriented to person, place, and time.  Psychiatric:        Mood and Affect: Mood normal.        Behavior: Behavior normal.        Thought Content: Thought content normal.        Judgment: Judgment normal.      Results for orders placed or performed in visit on 12/06/21  POCT glycosylated hemoglobin (Hb A1C)  Result Value Ref Range   Hemoglobin A1C     HbA1c POC (<> result, manual entry) 6.1 4.0 - 5.6 %   HbA1c, POC (prediabetic range)     HbA1c, POC (controlled diabetic range)      Assessment & Plan    1. Type 2 diabetes mellitus without complication, without long-term current use of insulin (HCC) hgbA1c 6.1 today. Continue diabetic medication as currently prescribed. Reassess in three months  - POCT glycosylated hemoglobin (Hb A1C)  2. Acute non-recurrent pansinusitis Start augmentin twice daily. Rest and increase fluids. Continue  using OTC medication to control symptoms.   - amoxicillin-clavulanate (AUGMENTIN) 250-62.5 MG/5ML suspension; Take 15 mLs by mouth 2 (two) times daily for 7 days.  Dispense: 210 mL; Refill: 0  3. Mild intermittent asthma, unspecified whether complicated Start advair 100/50 mcg twice daily. May use albuterol inhaler or neb solution up to four times daily as needed.  - albuterol (PROVENTIL) (2.5 MG/3ML) 0.083% nebulizer solution; Take 3 mLs (2.5 mg total) by nebulization every 6 (six) hours as needed for wheezing or shortness of breath.  Dispense: 75 mL; Refill: 12 - albuterol (VENTOLIN HFA) 108 (90 Base) MCG/ACT inhaler; Use 2 inhalations QID prn wheezing, SOB  Dispense: 18 g; Refill: 3 - fluticasone-salmeterol (ADVAIR) 100-50 MCG/ACT AEPB; Inhale 1 puff into the lungs 2 (two) times daily.  Dispense: 1 each; Refill: 3  4. Hypertension associated with type 2 diabetes mellitus (HCC) Stable. Continue bp medication as prescribed     Problem List Items Addressed This Visit       Cardiovascular and Mediastinum   Hypertension associated with type 2 diabetes mellitus (HCC)     Respiratory   Acute non-recurrent pansinusitis   Mild intermittent asthma   Relevant Medications   albuterol (PROVENTIL) (2.5 MG/3ML) 0.083% nebulizer solution   albuterol (VENTOLIN HFA) 108 (90 Base) MCG/ACT inhaler   fluticasone-salmeterol (ADVAIR) 100-50 MCG/ACT AEPB     Endocrine   Type 2 diabetes mellitus without complication, without long-term current use of insulin (HCC) - Primary   Relevant Orders   POCT glycosylated hemoglobin (Hb A1C) (Completed)     Return in about 3 months (around 03/08/2022) for diabetes with HgbA1c check, blood pressure.         Carlean Jews, NP  [facility] [phone] (phone) [phone] (fax)  [facility]

## 2022-01-25 ENCOUNTER — Encounter: Payer: Self-pay | Admitting: Nurse Practitioner

## 2022-01-25 MED ORDER — FLUTICASONE PROPIONATE 50 MCG/ACT NA SUSP: 2.0000 | Freq: Every day | NASAL | 6 refills | Status: DC

## 2022-02-06 ENCOUNTER — Other Ambulatory Visit: Payer: Self-pay | Admitting: Nurse Practitioner

## 2022-02-06 DIAGNOSIS — L309 Dermatitis, unspecified: Secondary | ICD-10-CM

## 2022-02-07 NOTE — Telephone Encounter (Signed)
L.O.V: 12/06/21  N.O.V: 03/08/22  L.R.F: 03/18/21 450 g 1 refill

## 2022-03-08 ENCOUNTER — Ambulatory Visit: Payer: Self-pay | Admitting: Nurse Practitioner

## 2022-03-13 ENCOUNTER — Encounter: Payer: Self-pay | Admitting: Nurse Practitioner

## 2022-03-14 ENCOUNTER — Other Ambulatory Visit: Payer: Self-pay | Admitting: Family Medicine

## 2022-03-14 ENCOUNTER — Encounter: Payer: Self-pay | Admitting: Family Medicine

## 2022-03-14 ENCOUNTER — Ambulatory Visit (INDEPENDENT_AMBULATORY_CARE_PROVIDER_SITE_OTHER): Payer: Self-pay | Admitting: Family Medicine

## 2022-03-14 VITALS — BP 178/132 | HR 83 | Resp 18 | Ht 72.0 in | Wt 272.0 lb

## 2022-03-14 DIAGNOSIS — E1159 Type 2 diabetes mellitus with other circulatory complications: Secondary | ICD-10-CM

## 2022-03-14 DIAGNOSIS — J019 Acute sinusitis, unspecified: Secondary | ICD-10-CM

## 2022-03-14 DIAGNOSIS — I152 Hypertension secondary to endocrine disorders: Secondary | ICD-10-CM

## 2022-03-14 DIAGNOSIS — E119 Type 2 diabetes mellitus without complications: Secondary | ICD-10-CM

## 2022-03-14 LAB — POCT GLYCOSYLATED HEMOGLOBIN (HGB A1C): HbA1c POC (<> result, manual entry): 7.1 % (ref 4.0–5.6)

## 2022-03-14 LAB — POCT UA - MICROALBUMIN
Albumin/Creatinine Ratio, Urine, POC: <30
Creatinine, POC: 300 mg/dL
Microalbumin Ur, POC: 80 mg/L

## 2022-03-14 MED ORDER — AMOXICILLIN-POT CLAVULANATE 250-62.5 MG/5ML PO SUSR: 15.0000 mL | Freq: Two times a day (BID) | ORAL | 0 refills | Status: AC

## 2022-03-14 MED ORDER — LOSARTAN POTASSIUM 100 MG PO TABS: 100.0000 mg | ORAL_TABLET | Freq: Every day | ORAL | 0 refills | Status: DC

## 2022-03-14 NOTE — Assessment & Plan Note (Signed)
Blood pressure elevated on initial presentation and remained elevated on manual recheck at the end of the visit.  He took his atenolol and losartan this morning.  He stopped taking the amlodipine because it was causing vision changes, balance issues and dizziness.  We discussed the importance of medication compliance as well as maintaining a heart healthy diet and exercise routine.  He is agreeable to increasing losartan to 100 mg daily at this time and maintaining his atenolol dose as is.  Recheck with Heather in 3 months.

## 2022-03-14 NOTE — Progress Notes (Addendum)
Acute Office Visit  Subjective:     Patient ID: Miguel Kent, male    DOB: January 23, 1991, 32 y.o.   MRN: [id number]  Chief Complaint  Patient presents with   Sinusitis    HPI Patient is in today for acute bacterial rhinosinusitis.  History of recurrent sinus infections, does see ENT who recommended surgery for deviated septum to decrease frequency of infections.  Denies fever, endorses copious amounts of green rhinorrhea.  This has been going on for 3 to 4 days now and feels like the beginning of his sinus infections usually are.  He wanted to get ahead of it as one of his more recent episodes of a sinus infection led to a visit to the emergency department.  He did take his atenolol and losartan this morning.  He has not been taking amlodipine because it was causing vision changes and balance issues.  Review of Systems  Constitutional:  Positive for chills. Negative for fever.  HENT:  Positive for congestion and sinus pain. Negative for ear pain, hearing loss and sore throat.   Eyes:  Negative for blurred vision and double vision.  Respiratory:  Negative for shortness of breath.   Cardiovascular:  Negative for chest pain and palpitations.     Objective:    BP (!) 178/132 (BP Location: Left Arm, Patient Position: Sitting, Cuff Size: Large)   Pulse 83   Resp 18   Ht 6' (1.829 m)   Wt 272 lb (123.4 kg)   SpO2 98%   BMI 36.89 kg/m    Physical Exam Constitutional:      General: He is not in acute distress.    Appearance: Normal appearance.  HENT:     Head: Normocephalic and atraumatic.     Comments: Maxillary and ethmoid sinus tenderness     Nose: Congestion and rhinorrhea present.     Comments: Deviated septum on left side    Mouth/Throat:     Mouth: Mucous membranes are moist.     Pharynx: No oropharyngeal exudate or posterior oropharyngeal erythema.  Eyes:     Conjunctiva/sclera: Conjunctivae normal.  Cardiovascular:     Rate and Rhythm: Normal rate and regular  rhythm.     Pulses: Normal pulses.     Heart sounds: Normal heart sounds.  Pulmonary:     Effort: Pulmonary effort is normal.     Breath sounds: Normal breath sounds.  Musculoskeletal:     Cervical back: Normal range of motion.  Skin:    General: Skin is warm and dry.  Neurological:     Mental Status: He is alert and oriented to person, place, and time.  Psychiatric:        Mood and Affect: Mood normal.    Results for orders placed or performed in visit on 03/14/22  POCT HgB A1C  Result Value Ref Range   Hemoglobin A1C     HbA1c POC (<> result, manual entry) 7.1 4.0 - 5.6 %   HbA1c, POC (prediabetic range)     HbA1c, POC (controlled diabetic range)    POCT UA - Microalbumin  Result Value Ref Range   Microalbumin Ur, POC 80 mg/L   Creatinine, POC 300 mg/dL   Albumin/Creatinine Ratio, Urine, POC <30       Assessment & Plan:   Problem List Items Addressed This Visit       Cardiovascular and Mediastinum   Hypertension associated with type 2 diabetes mellitus ([place])    Blood pressure elevated on initial presentation  and remained elevated on manual recheck at the end of the visit.  He took his atenolol and losartan this morning.  He stopped taking the amlodipine because it was causing vision changes, balance issues and dizziness.  We discussed the importance of medication compliance as well as maintaining a heart healthy diet and exercise routine.  He is agreeable to increasing losartan to 100 mg daily at this time and maintaining his atenolol dose as is.  Recheck with Heather in 3 months.      Relevant Medications   losartan (COZAAR) 100 MG tablet     Endocrine   Type 2 diabetes mellitus without complication, without long-term current use of insulin (HCC)    Checked A1c and microalbumin today.  Follow-up with Heather in 3 months.      Relevant Medications   losartan (COZAAR) 100 MG tablet   Other Relevant Orders   POCT HgB A1C (Completed)   POCT UA - Microalbumin  (Completed)   Other Visit Diagnoses     Acute rhinosinusitis    -  Primary   symptom management   Relevant Medications   amoxicillin-clavulanate (AUGMENTIN) 250-62.5 MG/5ML suspension       Meds ordered this encounter  Medications   amoxicillin-clavulanate (AUGMENTIN) 250-62.5 MG/5ML suspension    Sig: Take 15 mLs by mouth 2 (two) times daily for 7 days.    Dispense:  210 mL    Refill:  0    Order Specific Question:   Supervising Provider    Answer:   Beatrice Lecher D [[id number]]   losartan (COZAAR) 100 MG tablet    Sig: Take 1 tablet (100 mg total) by mouth daily.    Dispense:  90 tablet    Refill:  0    Order Specific Question:   Supervising Provider    Answer:   Beatrice Lecher D [[id number]]     Return in about 3 months (around 06/12/2022) for Follow up on HTN, DM with Heather.  Velva Harman, PA

## 2022-03-14 NOTE — Assessment & Plan Note (Signed)
Checked A1c and microalbumin today.  Follow-up with Heather in 3 months.

## 2022-04-18 ENCOUNTER — Ambulatory Visit: Payer: Self-pay | Admitting: Nurse Practitioner

## 2022-05-10 ENCOUNTER — Ambulatory Visit: Payer: Self-pay | Admitting: Nurse Practitioner

## 2022-05-17 ENCOUNTER — Ambulatory Visit: Payer: Self-pay | Admitting: Nurse Practitioner

## 2022-05-17 NOTE — Progress Notes (Deleted)
Established patient visit   Patient: Miguel Kent   DOB: March 02, 1990   32 y.o. Male  MRN: [id number] Visit Date: 05/17/2022   No chief complaint on file.  Subjective    HPI  Follow up  -   Medications: Outpatient Medications Prior to Visit  Medication Sig   albuterol (PROVENTIL) (2.5 MG/3ML) 0.083% nebulizer solution Take 3 mLs (2.5 mg total) by nebulization every 6 (six) hours as needed for wheezing or shortness of breath.   albuterol (VENTOLIN HFA) 108 (90 Base) MCG/ACT inhaler Use 2 inhalations QID prn wheezing, SOB   atenolol-chlorthalidone (TENORETIC) 100-25 MG tablet Take 1 tablet by mouth daily.   Azelastine HCl 137 MCG/SPRAY SOLN USE 1 SPRAY(S) IN EACH NOSTRIL TWICE DAILY AS DIRECTED FOR 7 DAYS   carbamide peroxide (DEBROX) 6.5 % OTIC solution Place 5 drops into the right ear 2 (two) times daily.   empagliflozin (JARDIANCE) 10 MG TABS tablet Take 1 tablet (10 mg total) by mouth daily before breakfast.   EPINEPHrine 0.3 mg/0.3 mL IJ SOAJ injection Inject 0.3 mg into the muscle as needed for anaphylaxis.   fluticasone (FLONASE) 50 MCG/ACT nasal spray Place 2 sprays into both nostrils daily.   fluticasone-salmeterol (ADVAIR) 100-50 MCG/ACT AEPB Inhale 1 puff into the lungs 2 (two) times daily.   losartan (COZAAR) 100 MG tablet Take 1 tablet (100 mg total) by mouth daily.   meclizine (ANTIVERT) 25 MG tablet Take 1 tablet (25 mg total) by mouth 3 (three) times daily as needed for dizziness or nausea.   triamcinolone ointment (KENALOG) 0.1 % APPLY ONE APPLICATION TOPICALLY TWICE DAILY   No facility-administered medications prior to visit.    Review of Systems  {Labs (Optional):23779}   Objective    There were no vitals filed for this visit. There is no height or weight on file to calculate BMI.  BP Readings from Last 3 Encounters:  03/14/22 (Abnormal) 178/132  12/06/21 (Abnormal) 140/97  08/14/21 (Abnormal) 138/99    Wt Readings from Last 3 Encounters:  03/14/22 272  lb (123.4 kg)  12/06/21 276 lb 1.6 oz (125.2 kg)  08/14/21 270 lb (122.5 kg)    Physical Exam  ***  No results found for any visits on 05/17/22.  Assessment & Plan    There are no diagnoses linked to this encounter.   No follow-ups on file.         Carlean Jews, NP  [facility] at [facility] [phone] (phone) [phone] (fax)  [facility]

## 2022-06-08 ENCOUNTER — Other Ambulatory Visit: Payer: Self-pay | Admitting: Nurse Practitioner

## 2022-06-08 DIAGNOSIS — L309 Dermatitis, unspecified: Secondary | ICD-10-CM

## 2022-06-13 ENCOUNTER — Ambulatory Visit: Payer: Self-pay | Admitting: Nurse Practitioner

## 2022-07-04 ENCOUNTER — Encounter: Payer: Self-pay | Admitting: Nurse Practitioner

## 2022-07-04 ENCOUNTER — Ambulatory Visit: Payer: Self-pay | Admitting: Nurse Practitioner

## 2022-07-04 ENCOUNTER — Ambulatory Visit (INDEPENDENT_AMBULATORY_CARE_PROVIDER_SITE_OTHER): Payer: Self-pay | Admitting: Nurse Practitioner

## 2022-07-04 VITALS — BP 140/95 | HR 91 | Ht 72.0 in | Wt 274.8 lb

## 2022-07-04 DIAGNOSIS — Z91010 Allergy to peanuts: Secondary | ICD-10-CM

## 2022-07-04 DIAGNOSIS — I152 Hypertension secondary to endocrine disorders: Secondary | ICD-10-CM

## 2022-07-04 DIAGNOSIS — E119 Type 2 diabetes mellitus without complications: Secondary | ICD-10-CM

## 2022-07-04 DIAGNOSIS — E1159 Type 2 diabetes mellitus with other circulatory complications: Secondary | ICD-10-CM

## 2022-07-04 DIAGNOSIS — J300 Vasomotor rhinitis: Secondary | ICD-10-CM

## 2022-07-04 DIAGNOSIS — I10 Essential (primary) hypertension: Secondary | ICD-10-CM

## 2022-07-04 DIAGNOSIS — J4520 Mild intermittent asthma, uncomplicated: Secondary | ICD-10-CM

## 2022-07-04 DIAGNOSIS — R42 Dizziness and giddiness: Secondary | ICD-10-CM

## 2022-07-04 DIAGNOSIS — L309 Dermatitis, unspecified: Secondary | ICD-10-CM

## 2022-07-04 LAB — POCT GLYCOSYLATED HEMOGLOBIN (HGB A1C): HbA1c POC (<> result, manual entry): 6.9 % (ref 4.0–5.6)

## 2022-07-04 MED ORDER — MECLIZINE HCL 25 MG PO TABS
25.0000 mg | ORAL_TABLET | Freq: Three times a day (TID) | ORAL | 1 refills | Status: AC | PRN
Start: 2022-07-04 — End: ?

## 2022-07-04 MED ORDER — ALBUTEROL SULFATE HFA 108 (90 BASE) MCG/ACT IN AERS: INHALATION_SPRAY | RESPIRATORY_TRACT | 3 refills | Status: DC

## 2022-07-04 MED ORDER — FLUTICASONE-SALMETEROL 100-50 MCG/ACT IN AEPB
1.0000 | INHALATION_SPRAY | Freq: Two times a day (BID) | RESPIRATORY_TRACT | 3 refills | Status: DC
Start: 2022-07-04 — End: 2023-07-12

## 2022-07-04 MED ORDER — FLUTICASONE PROPIONATE 50 MCG/ACT NA SUSP
2.0000 | Freq: Every day | NASAL | 4 refills | Status: DC
Start: 2022-07-04 — End: 2023-07-12

## 2022-07-04 MED ORDER — EPINEPHRINE 0.3 MG/0.3ML IJ SOAJ: 0.3000 mg | INTRAMUSCULAR | 1 refills | Status: AC | PRN

## 2022-07-04 MED ORDER — ATENOLOL-CHLORTHALIDONE 100-25 MG PO TABS: 1.0000 | ORAL_TABLET | Freq: Every day | ORAL | 1 refills | Status: DC

## 2022-07-04 MED ORDER — TRIAMCINOLONE ACETONIDE 0.1 % EX OINT
TOPICAL_OINTMENT | CUTANEOUS | 1 refills | Status: DC
Start: 2022-07-04 — End: 2023-07-12

## 2022-07-04 MED ORDER — LOSARTAN POTASSIUM 100 MG PO TABS
100.0000 mg | ORAL_TABLET | Freq: Every day | ORAL | 0 refills | Status: DC
Start: 2022-07-04 — End: 2022-11-03

## 2022-07-04 MED ORDER — GLIPIZIDE ER 2.5 MG PO TB24
2.5000 mg | ORAL_TABLET | Freq: Every day | ORAL | 1 refills | Status: DC
Start: 2022-07-04 — End: 2023-06-07

## 2022-07-04 MED ORDER — ALBUTEROL SULFATE (2.5 MG/3ML) 0.083% IN NEBU: 2.5000 mg | INHALATION_SOLUTION | Freq: Four times a day (QID) | RESPIRATORY_TRACT | 12 refills | Status: DC | PRN

## 2022-07-04 NOTE — Progress Notes (Signed)
Established patient visit   Patient: Miguel Kent   DOB: 1991/01/12   32 y.o. Male  MRN: [id number] Visit Date: 07/04/2022   Chief Complaint  Patient presents with   Medical Management of Chronic Issues   Subjective    HPI  Follow up  DM2 -HgbA1c today is 6.9 --currently taking Jardiance 10 mg daily and glipizide XL 2.5 mg daily  HTN -blood pressure elevated.  -no new concerns or complaints.  -He denies chest pain, chest pressure, or shortness of breath. He denies headaches or visual disturbances. He denies abdominal pain, nausea, vomiting, or changes in bowel or bladder habits.     Medications: Outpatient Medications Prior to Visit  Medication Sig   Azelastine HCl 137 MCG/SPRAY SOLN USE 1 SPRAY(S) IN EACH NOSTRIL TWICE DAILY AS DIRECTED FOR 7 DAYS   carbamide peroxide (DEBROX) 6.5 % OTIC solution Place 5 drops into the right ear 2 (two) times daily.   empagliflozin (JARDIANCE) 10 MG TABS tablet Take 1 tablet (10 mg total) by mouth daily before breakfast.   [DISCONTINUED] albuterol (PROVENTIL) (2.5 MG/3ML) 0.083% nebulizer solution Take 3 mLs (2.5 mg total) by nebulization every 6 (six) hours as needed for wheezing or shortness of breath.   [DISCONTINUED] albuterol (VENTOLIN HFA) 108 (90 Base) MCG/ACT inhaler Use 2 inhalations QID prn wheezing, SOB   [DISCONTINUED] atenolol-chlorthalidone (TENORETIC) 100-25 MG tablet Take 1 tablet by mouth daily.   [DISCONTINUED] EPINEPHrine 0.3 mg/0.3 mL IJ SOAJ injection Inject 0.3 mg into the muscle as needed for anaphylaxis.   [DISCONTINUED] fluticasone (FLONASE) 50 MCG/ACT nasal spray Place 2 sprays into both nostrils daily.   [DISCONTINUED] fluticasone-salmeterol (ADVAIR) 100-50 MCG/ACT AEPB Inhale 1 puff into the lungs 2 (two) times daily.   [DISCONTINUED] glipiZIDE (GLUCOTROL XL) 2.5 MG 24 hr tablet Take 2.5 mg by mouth daily with breakfast. Take 1 tablet po QD   [DISCONTINUED] losartan (COZAAR) 100 MG tablet Take 1 tablet (100 mg total)  by mouth daily.   [DISCONTINUED] meclizine (ANTIVERT) 25 MG tablet Take 1 tablet (25 mg total) by mouth 3 (three) times daily as needed for dizziness or nausea.   [DISCONTINUED] triamcinolone ointment (KENALOG) 0.1 % APPLY ONE APPLICATION TOPICALLY TWICE DAILY   No facility-administered medications prior to visit.    Review of Systems See HPI     Last CBC Lab Results  Component Value Date   WBC 9.4 08/14/2021   HGB 17.6 (H) 08/14/2021   HCT 52.5 (H) 08/14/2021   MCV 84.1 08/14/2021   MCH 28.2 08/14/2021   RDW 13.2 08/14/2021   PLT 305 08/14/2021   Last metabolic panel Lab Results  Component Value Date   GLUCOSE 168 (H) 08/14/2021   NA 137 08/14/2021   K 2.7 (LL) 08/14/2021   CL 97 (L) 08/14/2021   CO2 28 08/14/2021   BUN 21 (H) 08/14/2021   CREATININE 1.14 08/14/2021   GFRNONAA >60 08/14/2021   CALCIUM 9.7 08/14/2021   ANIONGAP 12 08/14/2021   Last hemoglobin A1c Lab Results  Component Value Date   HGBA1C 6.9 07/04/2022        Objective     Today's Vitals   07/04/22 1534 07/04/22 1553  BP: (Abnormal) 140/90 (Abnormal) 140/95  Pulse: 91   SpO2: 98%   Weight: 274 lb 12.8 oz (124.6 kg)   Height: 6' (1.829 m)    Body mass index is 37.27 kg/m.  BP Readings from Last 3 Encounters:  07/04/22 (Abnormal) 140/95  03/14/22 (Abnormal) 178/132  12/06/21 (Abnormal) 140/97  Wt Readings from Last 3 Encounters:  07/04/22 274 lb 12.8 oz (124.6 kg)  03/14/22 272 lb (123.4 kg)  12/06/21 276 lb 1.6 oz (125.2 kg)    Physical Exam Vitals and nursing note reviewed.  Constitutional:      Appearance: Normal appearance. He is well-developed.  HENT:     Head: Normocephalic and atraumatic.     Nose: Nose normal.     Mouth/Throat:     Mouth: Mucous membranes are moist.     Pharynx: Oropharynx is clear.  Eyes:     Extraocular Movements: Extraocular movements intact.     Conjunctiva/sclera: Conjunctivae normal.     Pupils: Pupils are equal, round, and reactive to  light.  Neck:     Vascular: No carotid bruit.  Cardiovascular:     Rate and Rhythm: Normal rate and regular rhythm.     Pulses: Normal pulses.     Heart sounds: Normal heart sounds.  Pulmonary:     Effort: Pulmonary effort is normal.     Breath sounds: Normal breath sounds.  Abdominal:     Palpations: Abdomen is soft.  Musculoskeletal:        General: Normal range of motion.     Cervical back: Normal range of motion and neck supple.  Lymphadenopathy:     Cervical: No cervical adenopathy.  Skin:    General: Skin is warm and dry.     Capillary Refill: Capillary refill takes less than 2 seconds.  Neurological:     General: No focal deficit present.     Mental Status: He is alert and oriented to person, place, and time.  Psychiatric:        Mood and Affect: Mood normal.        Behavior: Behavior normal.        Thought Content: Thought content normal.        Judgment: Judgment normal.     Results for orders placed or performed in visit on 07/04/22  POCT glycosylated hemoglobin (Hb A1C)  Result Value Ref Range   Hemoglobin A1C     HbA1c POC (<> result, manual entry) 6.9 4.0 - 5.6 %   HbA1c, POC (prediabetic range)     HbA1c, POC (controlled diabetic range)      Assessment & Plan    Type 2 diabetes mellitus without complication, without long-term current use of insulin (HCC) Assessment & Plan: Hemoglobin A1c is 6.9 today. -Continue Jardiance 10 mg and glipizide XL 2.5 mg daily. -Limit intake of carbohydrates and sugar.  Increase water intake. -Recheck hemoglobin A1c in 3 months.  Orders: -     POCT glycosylated hemoglobin (Hb A1C) -     glipiZIDE ER; Take 1 tablet (2.5 mg total) by mouth daily with breakfast. Take 1 tablet po QD  Dispense: 90 tablet; Refill: 1  Hypertension associated with type 2 diabetes mellitus (HCC) Assessment & Plan: Blood pressure elevated on initial presentation and remained elevated on manual recheck at the end of the visit.  He took his  atenolol, but has not taken losartan. We discussed the importance of medication compliance as well as maintaining a heart healthy diet and exercise routine.   -add back losartan 100 mg daily at this time.  =continue atenolol dose as is.    Orders: -     POCT glycosylated hemoglobin (Hb A1C) -     Losartan Potassium; Take 1 tablet (100 mg total) by mouth daily.  Dispense: 90 tablet; Refill: 0  Mild intermittent asthma,  unspecified whether complicated Assessment & Plan: Continue Advair, 1 inhalation twice daily. -Use rescue inhaler up to 4 times daily as needed for acute exacerbation. -New prescription sent for both inhalers today.  Orders: -     Albuterol Sulfate; Take 3 mLs (2.5 mg total) by nebulization every 6 (six) hours as needed for wheezing or shortness of breath.  Dispense: 75 mL; Refill: 12 -     Albuterol Sulfate HFA; Use 2 inhalations QID prn wheezing, SOB  Dispense: 18 g; Refill: 3 -     Fluticasone-Salmeterol; Inhale 1 puff into the lungs 2 (two) times daily.  Dispense: 1 each; Refill: 3  Eczema, unspecified type Assessment & Plan: Use triamcinolone up to twice daily as needed.   Orders: -     Triamcinolone Acetonide; APPLY ONE APPLICATION TOPICALLY TWICE DAILY  Dispense: 454 g; Refill: 1  Vasomotor rhinitis Assessment & Plan: Continue to use Flonase nasal spray, 2 sprays in both nostrils daily.  Orders: -     Fluticasone Propionate; Place 2 sprays into both nostrils daily.  Dispense: 16 g; Refill: 4  Vertigo Assessment & Plan: May take meclizine as needed and as prescribed for vertigo.  Orders: -     Meclizine HCl; Take 1 tablet (25 mg total) by mouth 3 (three) times daily as needed for dizziness or nausea.  Dispense: 30 tablet; Refill: 1  History of peanut allergy Assessment & Plan: Renewed prescription for EpiPen. -Use as needed.  Patient and his mother understand patient should visit the emergency department if use of EpiPen is necessary.  Orders: -      EPINEPHrine; Inject 0.3 mg into the muscle as needed for anaphylaxis.  Dispense: 1 each; Refill: 1  Primary hypertension -     Atenolol-Chlorthalidone; Take 1 tablet by mouth daily.  Dispense: 90 tablet; Refill: 1     Return in about 4 months (around 11/03/2022) for diabetes with HgbA1c check, blood pressure.         Carlean Jews, NP  [facility] [phone] (phone) [phone] (fax)  [facility]

## 2022-07-21 IMAGING — CT CT HEAD W/O CM
4 series · 15 of 47 positions shown, 17 images · non-contrast
Comparison: CT 10/12/2018 report

CLINICAL DATA: Dizziness

EXAM:
CT HEAD WITHOUT CONTRAST
TECHNIQUE: Contiguous axial images were obtained from the base of the skull
through the vertex without intravenous contrast.

[Series 2: axial st head 5.00 ax · axial · 0.37mm/px · z∈[-568,-463]mm · 7 of 29 slices shown, 9 images]
[im 4/29  brain]
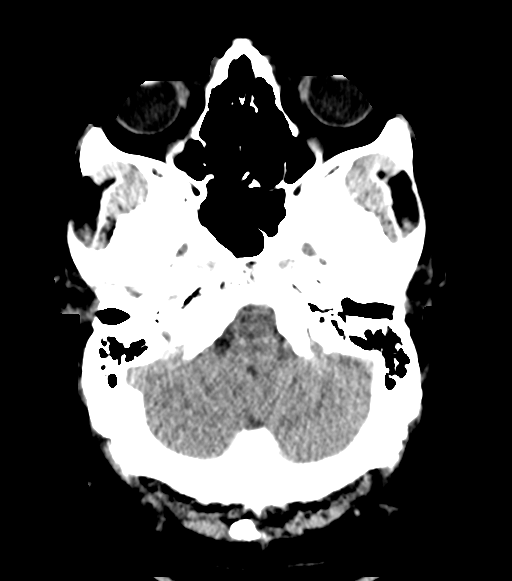
[im 4/29  bone]
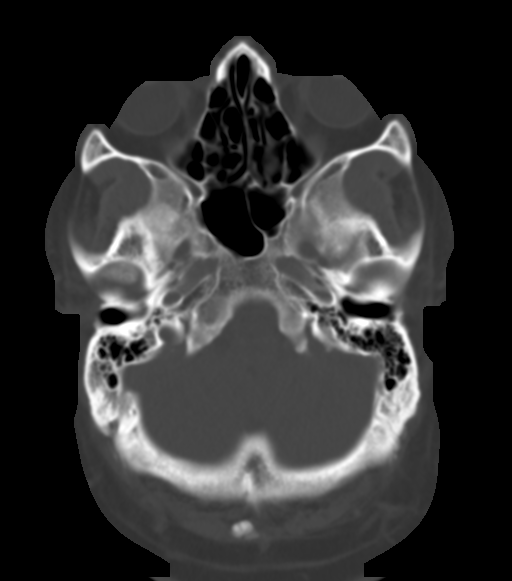
[im 8/29  brain]
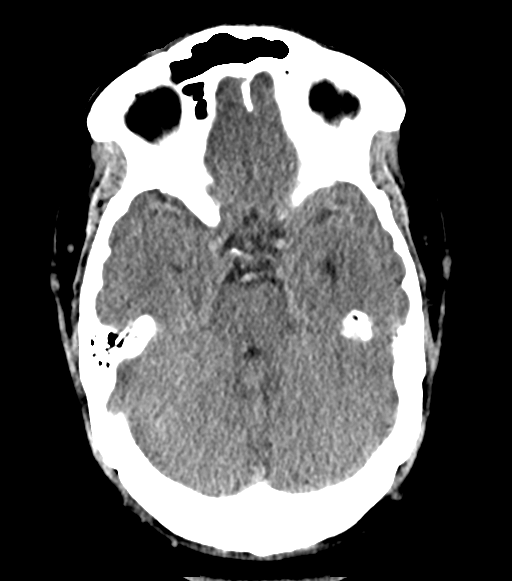
[im 11/29  brain]
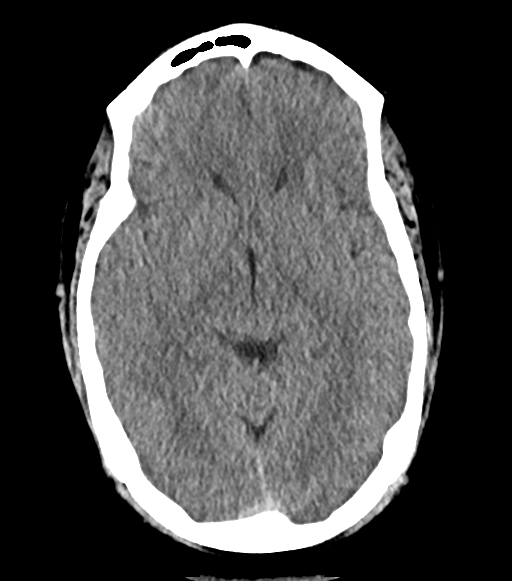
[im 15/29  brain]
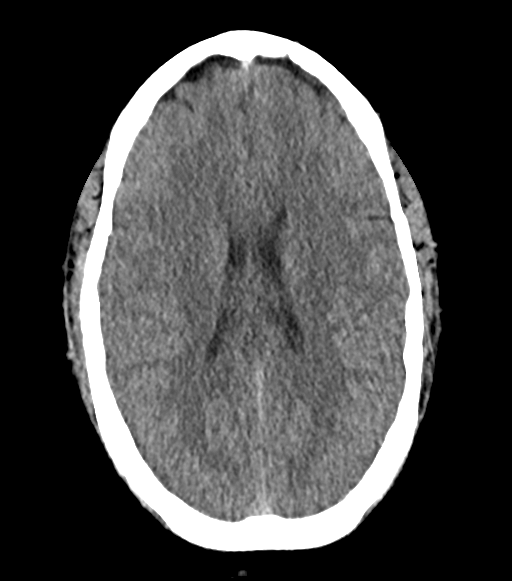
[im 18/29  brain]
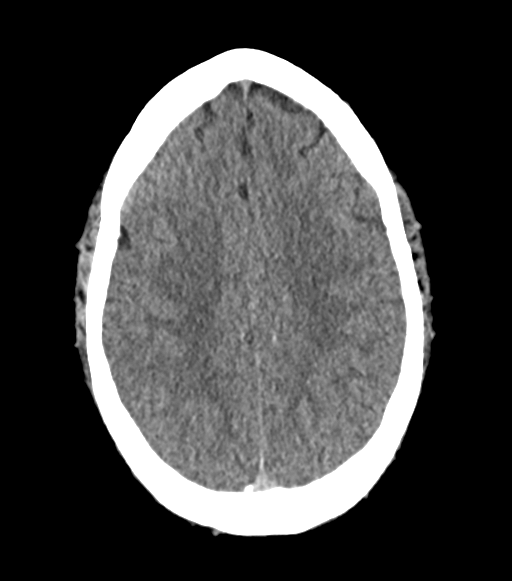
[im 18/29  bone]
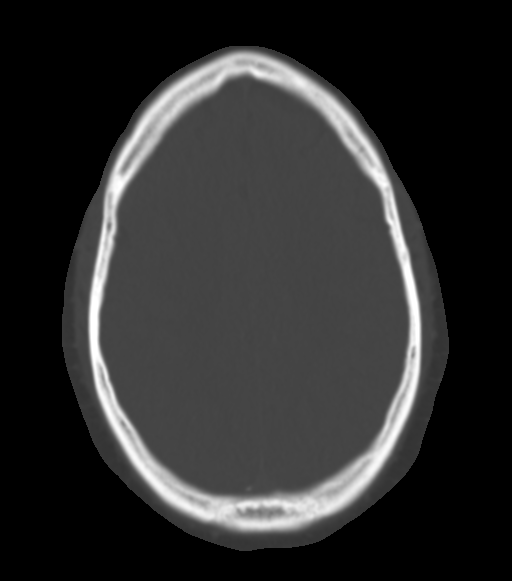
[im 22/29  brain]
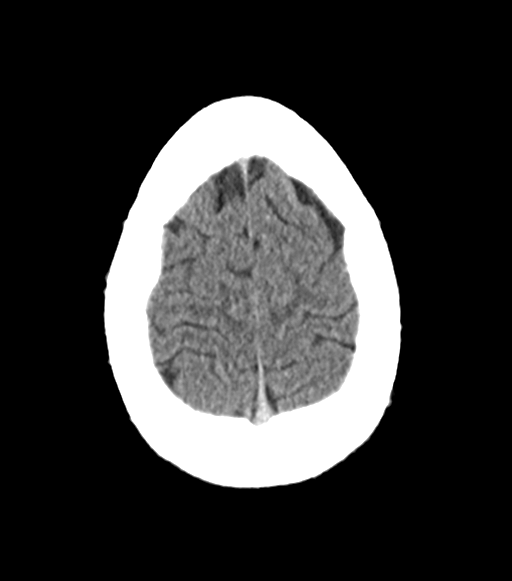
[im 25/29  brain]
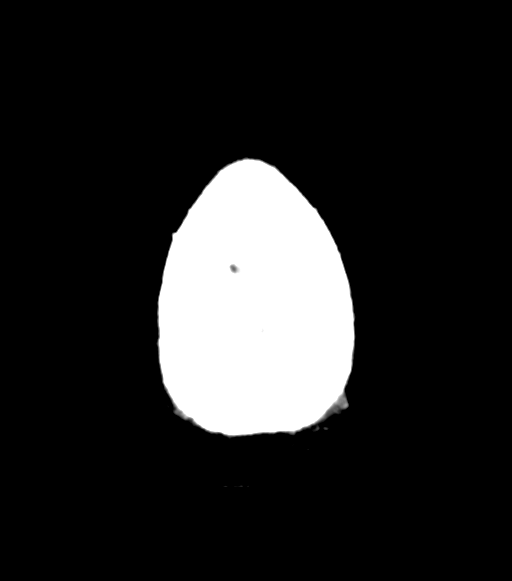

[Series 4: bone windows head 2.00 ax · axial · 0.37mm/px · z∈[-569,-555]mm · 2 of 73 slices shown]
[im 8/73  bone]
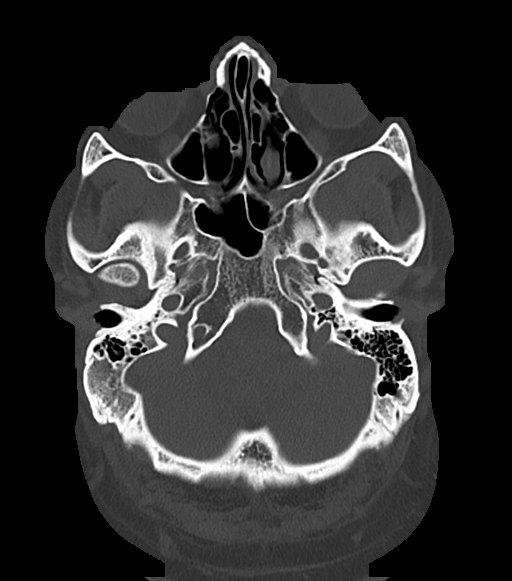
[im 15/73  bone]
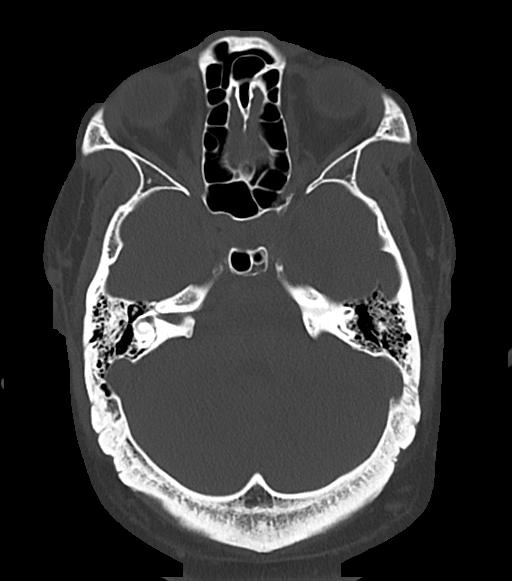

[Series 6: coronals head 3.00 cor · coronal · 0.29mm/px · 3 of 71 slices shown]
[im 24/71  brain]
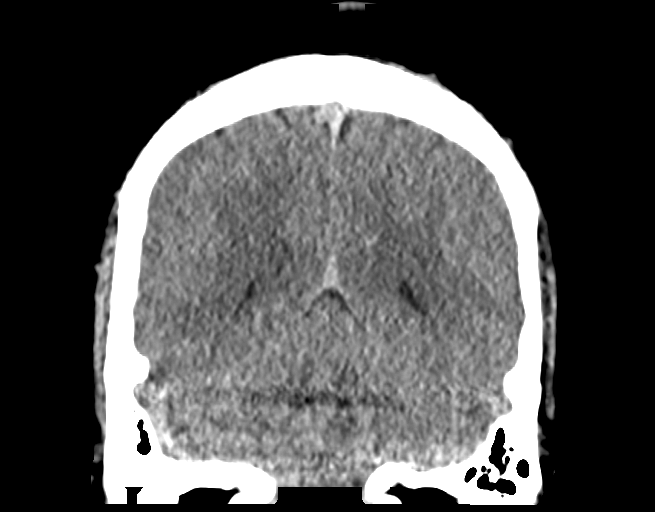
[im 32/71  brain]
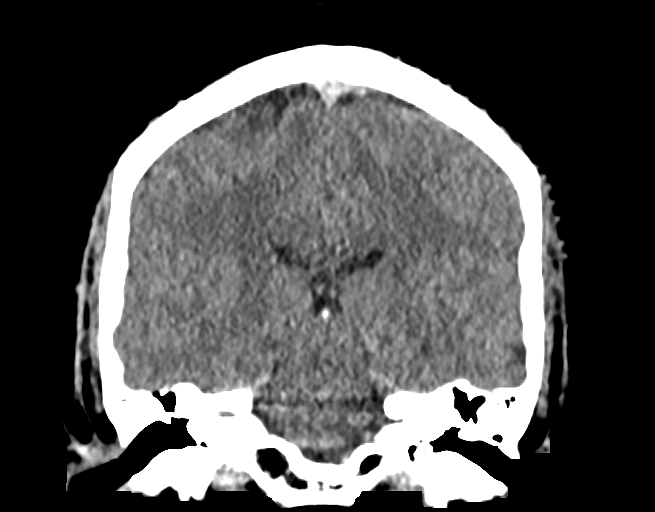
[im 39/71  brain]
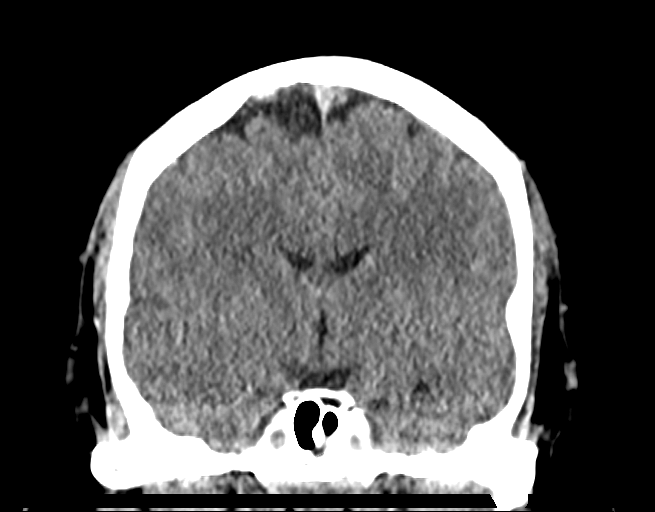

[Series 8: sagittals head 3.00 sag · sagittal · 0.29mm/px · 3 of 62 slices shown]
[im 21/62  brain]
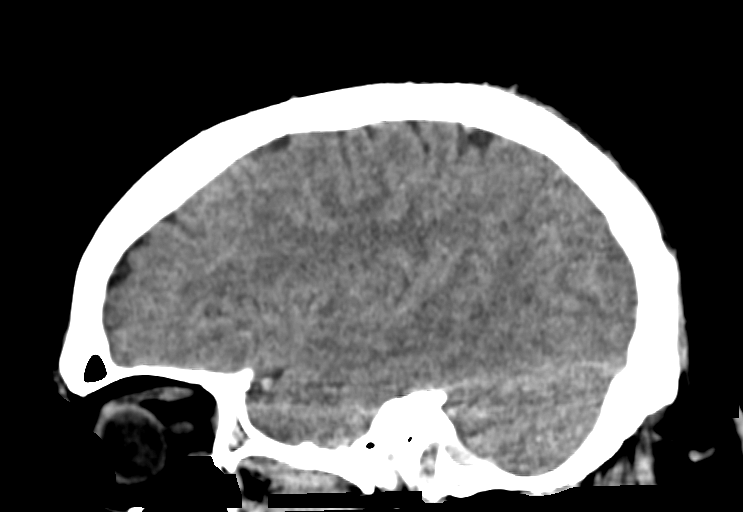
[im 31/62  brain]
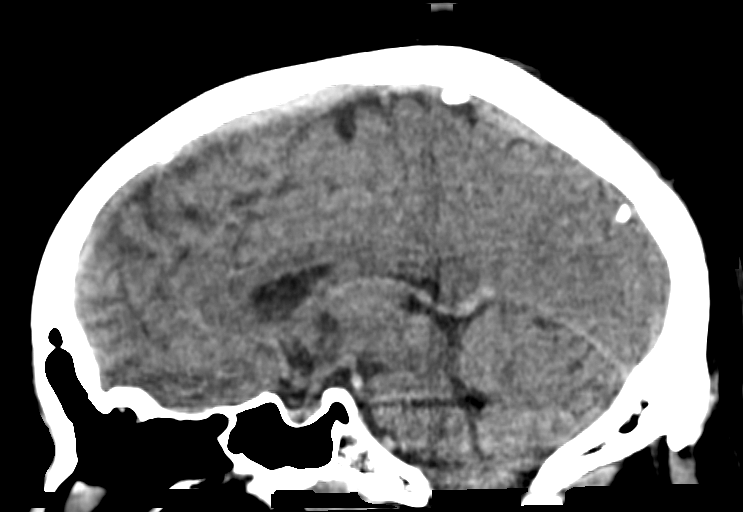
[im 41/62  brain]
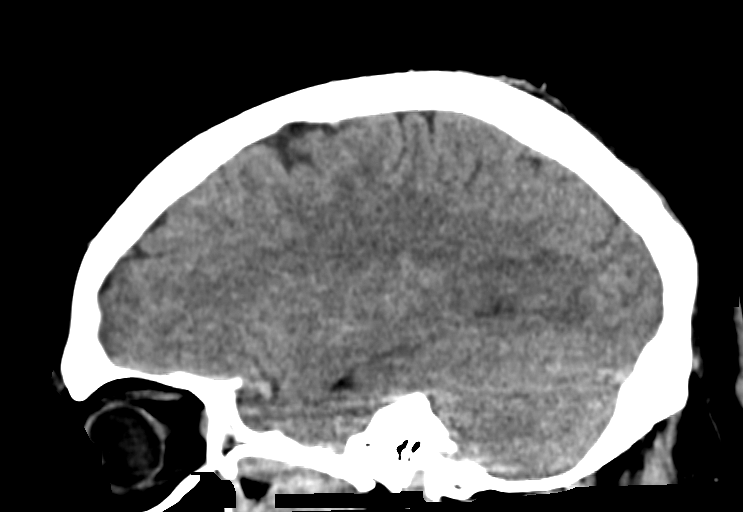

[15 of 47 positions shown; findings below may reference images not displayed]

FINDINGS: Brain: No evidence of acute infarction, hemorrhage, hydrocephalus,
extra-axial collection or mass lesion/mass effect.

Vascular: No hyperdense vessel or unexpected calcification.

Skull: Normal. Negative for fracture or focal lesion.

Sinuses/Orbits: No acute finding.

Other: None
IMPRESSION: Negative non contrasted CT appearance of the brain

## 2022-07-23 DIAGNOSIS — J300 Vasomotor rhinitis: Secondary | ICD-10-CM | POA: Insufficient documentation

## 2022-07-23 DIAGNOSIS — Z91010 Allergy to peanuts: Secondary | ICD-10-CM | POA: Insufficient documentation

## 2022-07-23 NOTE — Assessment & Plan Note (Addendum)
Hemoglobin A1c is 6.9 today. -Continue Jardiance 10 mg and glipizide XL 2.5 mg daily. -Limit intake of carbohydrates and sugar.  Increase water intake. -Recheck hemoglobin A1c in 4 months.

## 2022-07-23 NOTE — Assessment & Plan Note (Signed)
Continue to use Flonase nasal spray, 2 sprays in both nostrils daily.

## 2022-07-23 NOTE — Assessment & Plan Note (Signed)
Use triamcinolone up to twice daily as needed.

## 2022-07-23 NOTE — Assessment & Plan Note (Signed)
Continue Advair, 1 inhalation twice daily. -Use rescue inhaler up to 4 times daily as needed for acute exacerbation. -New prescription sent for both inhalers today.

## 2022-07-23 NOTE — Assessment & Plan Note (Signed)
May take meclizine as needed and as prescribed for vertigo.

## 2022-07-23 NOTE — Assessment & Plan Note (Signed)
Blood pressure elevated on initial presentation and remained elevated on manual recheck at the end of the visit.  He took his atenolol, but has not taken losartan. We discussed the importance of medication compliance as well as maintaining a heart healthy diet and exercise routine.   -add back losartan 100 mg daily at this time.  =continue atenolol dose as is.

## 2022-07-23 NOTE — Assessment & Plan Note (Signed)
Renewed prescription for EpiPen. -Use as needed.  Patient and his mother understand patient should visit the emergency department if use of EpiPen is necessary.

## 2022-08-09 ENCOUNTER — Telehealth: Payer: Self-pay | Admitting: *Deleted

## 2022-08-09 DIAGNOSIS — E1159 Type 2 diabetes mellitus with other circulatory complications: Secondary | ICD-10-CM

## 2022-08-09 MED ORDER — ATENOLOL 100 MG PO TABS
100.0000 mg | ORAL_TABLET | Freq: Every day | ORAL | 3 refills | Status: DC
Start: 2022-08-09 — End: 2023-07-12

## 2022-08-09 NOTE — Telephone Encounter (Signed)
Prescription has been sent for atenolol.  It looks like he should also be taking losartan according to Heather's last note which is a separate prescription.  I did remove the atenolol-chlorthalidone from his medication list since it caused low potassium.

## 2022-08-09 NOTE — Telephone Encounter (Signed)
Pt calling requesting a refill on atenolol.  Stated that he is just finishing the Rx that was given from previous provider.  He said that he had told Herbert Seta that he doesn't want to take the combo pill because it caused his potassium to drop.  He would like to have the below medication sent in.      atenolol (TENORMIN) 100 MG tablet    [facility] [address] [place] - [address] [place] [address] Phone: [phone]  Fax: [phone]

## 2022-08-14 NOTE — Telephone Encounter (Signed)
LVM for pt to call and inform him of below.

## 2022-11-03 ENCOUNTER — Encounter: Payer: Self-pay | Admitting: Family Medicine

## 2022-11-03 ENCOUNTER — Ambulatory Visit (INDEPENDENT_AMBULATORY_CARE_PROVIDER_SITE_OTHER): Payer: Self-pay | Admitting: Family Medicine

## 2022-11-03 VITALS — BP 140/80 | HR 86 | Ht 72.0 in | Wt 281.4 lb

## 2022-11-03 DIAGNOSIS — J4540 Moderate persistent asthma, uncomplicated: Secondary | ICD-10-CM

## 2022-11-03 DIAGNOSIS — E611 Iron deficiency: Secondary | ICD-10-CM | POA: Insufficient documentation

## 2022-11-03 DIAGNOSIS — E119 Type 2 diabetes mellitus without complications: Secondary | ICD-10-CM

## 2022-11-03 DIAGNOSIS — E1159 Type 2 diabetes mellitus with other circulatory complications: Secondary | ICD-10-CM

## 2022-11-03 DIAGNOSIS — I152 Hypertension secondary to endocrine disorders: Secondary | ICD-10-CM

## 2022-11-03 LAB — POCT GLYCOSYLATED HEMOGLOBIN (HGB A1C): HbA1c POC (<> result, manual entry): 7 % (ref 4.0–5.6)

## 2022-11-03 MED ORDER — SPIRONOLACTONE 25 MG PO TABS: 25.0000 mg | ORAL_TABLET | Freq: Every day | ORAL | 3 refills | Status: DC

## 2022-11-03 NOTE — Progress Notes (Signed)
Established Patient Office Visit  Subjective   Patient ID: Miguel Kent, male    DOB: [birth year]  Age: 32 y.o. MRN: [id number]  Chief Complaint  Patient presents with   Medical Management of Chronic Issues    HPI  Hypertension-patient would like to try something else other than his losartan.  States it causes a headache after he takes it.  He is also taking atenolol 100 mg.  He is not taking the atenolol that also contains HCTZ.  Takes the atenolol in the morning and then takes the losartan later on in the day.  Did not take the atenolol with HCTZ because it caused his potassium to decrease to low.  We talked about spironolactone as an option.  Amlodipine in the past has also caused headache.  Patient does not check his blood pressure at home.  Diabetes as patient only taking glipizide.  He never picked up the Jardiance that was initially prescribed to him.  He does not have insurance currently.  We talked about applying for Medicaid.  They are currently applying for private insurance but I have never thought about Medicaid in the past.  He has not been working for the past 1.5 years due to symptoms including vertigo.  In the past metformin gave him diarrhea so he no longer takes it.  Patient has appointment with [facility] in November for further evaluation of his vertigo.  Asthma-patient takes his daily maintenance inhaler at least once a days, sometimes twice.  Uses his albuterol nebulizer few times a day due to asthma complaints.  He has seasonal allergies but does not use over-the-counter second-generation antihistamines because dose makes him drowsy.  We discussed cutting the dose in half and seeing if it still made him drowsy.  He uses it daily nasal corticosteroid spray.  We discussed the patient's iron level and iron saturation level from past values.  He is not currently on iron supplementation.  We discussed how his iron saturation level was low and we can repeat this.  Discussed  OTC p.o. iron supplementation    The ASCVD Risk score (Arnett DK, et al., 2019) failed to calculate for the following reasons:   The 2019 ASCVD risk score is only valid for ages 78 to 28  Health Maintenance Due  Topic Date Due   FOOT EXAM  Never done   OPHTHALMOLOGY EXAM  Never done   HIV Screening  Never done   Hepatitis C Screening  Never done   Diabetic kidney evaluation - eGFR measurement  08/15/2022   COVID-19 Vaccine (1 - 2023-24 season) Never done      Objective:     BP (!) 140/80   Pulse 86   Ht 6' (1.829 m)   Wt 281 lb 6.4 oz (127.6 kg)   SpO2 96%   BMI 38.16 kg/m    Physical Exam  General: Alert, oriented CV: Regular rate rhythm no murmurs Pulmonary: Scattered bilateral no wheezes or crackles GI: Soft, large pannus. Extremities: Normal foot exam, normal pulses bilaterally, normal sensation.   Results for orders placed or performed in visit on 11/03/22  POCT HgB A1C  Result Value Ref Range   Hemoglobin A1C     HbA1c POC (<> result, manual entry) 7.0 4.0 - 5.6 %   HbA1c, POC (prediabetic range)     HbA1c, POC (controlled diabetic range)          Assessment & Plan:   Type 2 diabetes mellitus without complication, without long-term current use  of insulin (HCC) Assessment & Plan: A1c 7 today.  Only using glipizide.  Does not tolerate metformin, other medications are affordable without insurance. - Normal foot exam No changes to medications After patient gets insurance we can discuss additional medications if needed including Jardiance and/or GLP-1's.  Orders: -     POCT glycosylated hemoglobin (Hb A1C)  Iron deficiency Assessment & Plan: Most recent CBC did not show anemia.  Previous iron studies showed normal iron with low iron saturation.  Discussed over-the-counter p.o. iron supplementation with patient.  Repeating iron studies  Orders: -     Iron, TIBC and Ferritin Panel  Hypertension associated with diabetes (HCC) Assessment &  Plan: Taking atenolol 100 mg.  Does not tolerate amlodipine or losartan.  HCTZ was associated with hypokalemia. - Spironolactone 25 mg started - Follow-up BMP - Return to clinic 1 month. - Repeat BMP in 2 weeks.  Orders: -     Basic metabolic panel  Moderate persistent asthma, unspecified whether complicated Assessment & Plan: Taking twice daily maintenance inhaler with albuterol nebulizer and albuterol inhaler.  Does not always use it twice a day.  Affordability is an issue with our insurance.  Recommended patient use maintenance inhaler twice daily every day.  Recommended patient decrease the dose of his over-the-counter antihistamine to decrease drowsiness and encouraged him to take daily antihistamine for better control of allergies that may be contributing to worsening asthma.   Other orders -     Spironolactone; Take 1 tablet (25 mg total) by mouth daily.  Dispense: 90 tablet; Refill: 3     Return in about 4 weeks (around 12/01/2022) for HTN.    Sandre Kitty, MD

## 2022-11-03 NOTE — Patient Instructions (Signed)
It was nice to see you today,  We addressed the following topics today: -Your blood pressure was a little bit high today.  Continue taking the atenolol.  I will add a medicine called spironolactone that you can take once daily. - I will give you the lab tests paperwork that you can get done at any [facility] location. - I would like to see back in a month to recheck your blood pressure - I would recommend looking into Medicaid to see if you are eligible for this.  You can go to the [employer] office for help with applying for this or you can do it online at their website. - Continue taking your inhalers.  You should take your daily maintenance inhaler twice a day.  Try taking the over-the-counter Zyrtec or Claritin and cutting the tablet in half to see if you tolerate this better without drowsiness.  Have a great day,  Frederic Jericho, MD

## 2022-11-03 NOTE — Assessment & Plan Note (Signed)
Most recent CBC did not show anemia.  Previous iron studies showed normal iron with low iron saturation.  Discussed over-the-counter p.o. iron supplementation with patient.  Repeating iron studies

## 2022-11-03 NOTE — Assessment & Plan Note (Signed)
Taking twice daily maintenance inhaler with albuterol nebulizer and albuterol inhaler.  Does not always use it twice a day.  Affordability is an issue with our insurance.  Recommended patient use maintenance inhaler twice daily every day.  Recommended patient decrease the dose of his over-the-counter antihistamine to decrease drowsiness and encouraged him to take daily antihistamine for better control of allergies that may be contributing to worsening asthma.

## 2022-11-03 NOTE — Assessment & Plan Note (Signed)
Taking atenolol 100 mg.  Does not tolerate amlodipine or losartan.  HCTZ was associated with hypokalemia. - Spironolactone 25 mg started - Follow-up BMP - Return to clinic 1 month. - Repeat BMP in 2 weeks.

## 2022-11-03 NOTE — Assessment & Plan Note (Signed)
A1c 7 today.  Only using glipizide.  Does not tolerate metformin, other medications are affordable without insurance. - Normal foot exam No changes to medications After patient gets insurance we can discuss additional medications if needed including Jardiance and/or GLP-1's.

## 2022-12-01 ENCOUNTER — Ambulatory Visit: Payer: Self-pay | Admitting: Family Medicine

## 2022-12-05 ENCOUNTER — Encounter: Payer: Self-pay | Admitting: Family Medicine

## 2022-12-05 ENCOUNTER — Other Ambulatory Visit: Payer: Self-pay | Admitting: Family Medicine

## 2022-12-05 MED ORDER — AMOXICILLIN-POT CLAVULANATE 250-62.5 MG/5ML PO SUSR: 750.0000 mg | Freq: Two times a day (BID) | ORAL | 0 refills | Status: DC

## 2022-12-08 ENCOUNTER — Ambulatory Visit: Payer: Self-pay | Admitting: Family Medicine

## 2022-12-15 ENCOUNTER — Ambulatory Visit: Payer: Self-pay | Admitting: Family Medicine

## 2023-01-11 NOTE — Progress Notes (Deleted)
   Established Patient Office Visit  Subjective   Patient ID: Miguel Kent, male    DOB: [birth year]  Age: 32 y.o. MRN: [id number]  No chief complaint on file.   HPI  Htn - atenolol 100.  Started spironolactone  DM2 -document foot exam.  Taking lipizide.  Insurance?    The ASCVD Risk score (Arnett DK, et al., 2019) failed to calculate for the following reasons:   The 2019 ASCVD risk score is only valid for ages 66 to 61  Health Maintenance Due  Topic Date Due   FOOT EXAM  Never done   OPHTHALMOLOGY EXAM  Never done   HIV Screening  Never done   Hepatitis C Screening  Never done   Diabetic kidney evaluation - eGFR measurement  08/15/2022   COVID-19 Vaccine (1 - 2024-25 season) Never done      Objective:     There were no vitals taken for this visit. {Vitals History (Optional):23777}  Physical Exam   No results found for any visits on 01/12/23.      Assessment & Plan:   There are no diagnoses linked to this encounter.   No follow-ups on file.    Sandre Kitty, MD

## 2023-01-12 ENCOUNTER — Ambulatory Visit: Payer: Self-pay | Admitting: Family Medicine

## 2023-01-25 ENCOUNTER — Other Ambulatory Visit: Payer: Self-pay | Admitting: Nurse Practitioner

## 2023-01-25 DIAGNOSIS — E1159 Type 2 diabetes mellitus with other circulatory complications: Secondary | ICD-10-CM

## 2023-01-25 DIAGNOSIS — J4520 Mild intermittent asthma, uncomplicated: Secondary | ICD-10-CM

## 2023-02-16 ENCOUNTER — Ambulatory Visit: Payer: Self-pay | Admitting: Family Medicine

## 2023-03-16 ENCOUNTER — Ambulatory Visit: Payer: Self-pay | Admitting: Family Medicine

## 2023-03-27 ENCOUNTER — Other Ambulatory Visit: Payer: Self-pay | Admitting: Nurse Practitioner

## 2023-03-27 ENCOUNTER — Telehealth: Payer: Self-pay

## 2023-03-27 DIAGNOSIS — E1159 Type 2 diabetes mellitus with other circulatory complications: Secondary | ICD-10-CM

## 2023-03-27 NOTE — Telephone Encounter (Signed)
Called pharmacy twice no answer.

## 2023-03-28 ENCOUNTER — Other Ambulatory Visit: Payer: Self-pay | Admitting: Nurse Practitioner

## 2023-03-28 ENCOUNTER — Telehealth: Payer: Self-pay

## 2023-03-28 ENCOUNTER — Encounter: Payer: Self-pay | Admitting: Family Medicine

## 2023-03-28 DIAGNOSIS — I152 Hypertension secondary to endocrine disorders: Secondary | ICD-10-CM

## 2023-03-28 NOTE — Telephone Encounter (Signed)
 Copied from CRM [id number]. Topic: Clinical - Prescription Issue >> Mar 28, 2023 12:14 PM Dyann Kief wrote: Reason for CRM: Pt requesting a call regarding the status of the pt Losartan medication, [facility] stated they sent a request. Pls advise  Best call back [phone]

## 2023-03-28 NOTE — Telephone Encounter (Signed)
 From my note in October he says he didn't tolerate losartan so we switched him to spironolactone.  Is he saying he has still been taking the losartan?  If so I can send in the refill.

## 2023-03-28 NOTE — Telephone Encounter (Signed)
 Called pt LVM to contact the office

## 2023-04-23 ENCOUNTER — Ambulatory Visit: Payer: Self-pay | Admitting: Family Medicine

## 2023-05-08 ENCOUNTER — Encounter: Payer: Self-pay | Admitting: Family Medicine

## 2023-05-09 ENCOUNTER — Other Ambulatory Visit: Payer: Self-pay | Admitting: Family Medicine

## 2023-05-09 MED ORDER — AMOXICILLIN-POT CLAVULANATE 250-62.5 MG/5ML PO SUSR: 750.0000 mg | Freq: Two times a day (BID) | ORAL | 0 refills | Status: DC

## 2023-05-12 IMAGING — CR DG CHEST 2V
2 series · 2 of 2 positions shown · non-contrast
Comparison: None Available.

CLINICAL DATA: Fever and cough

EXAM:
CHEST - 2 VIEW

[chest pa]
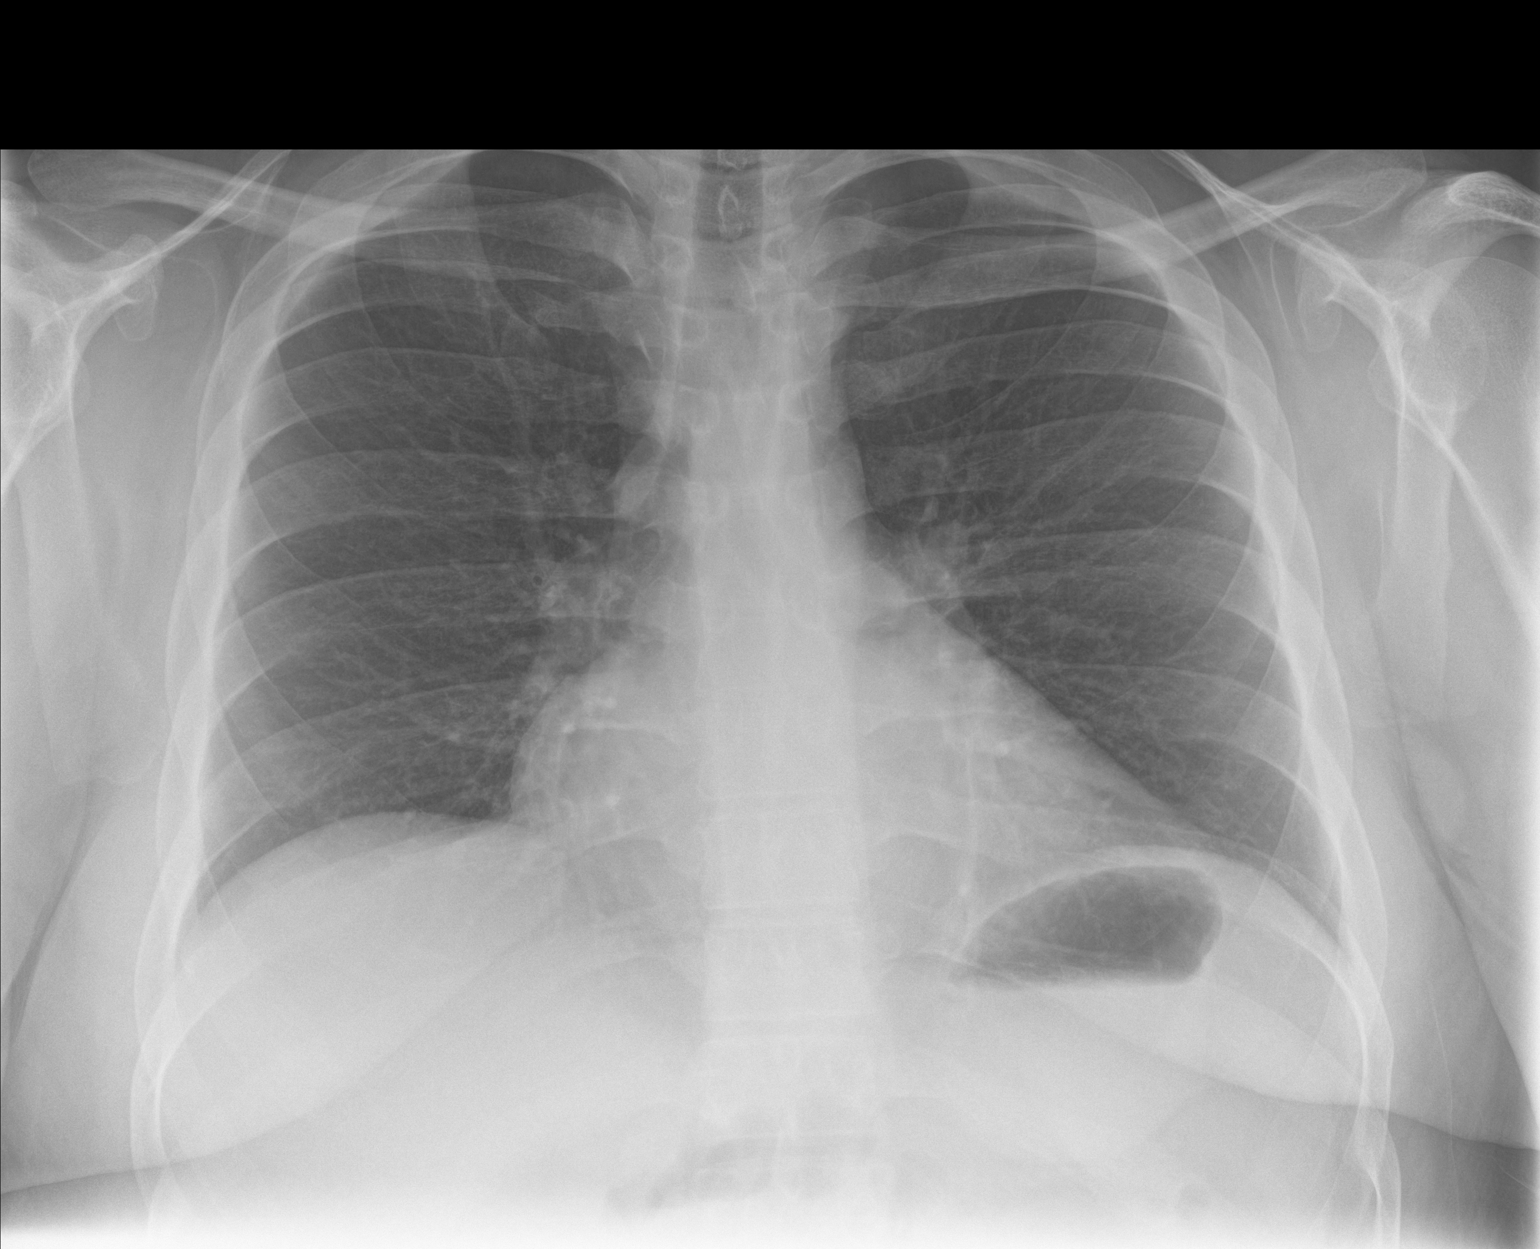

[chest lat]
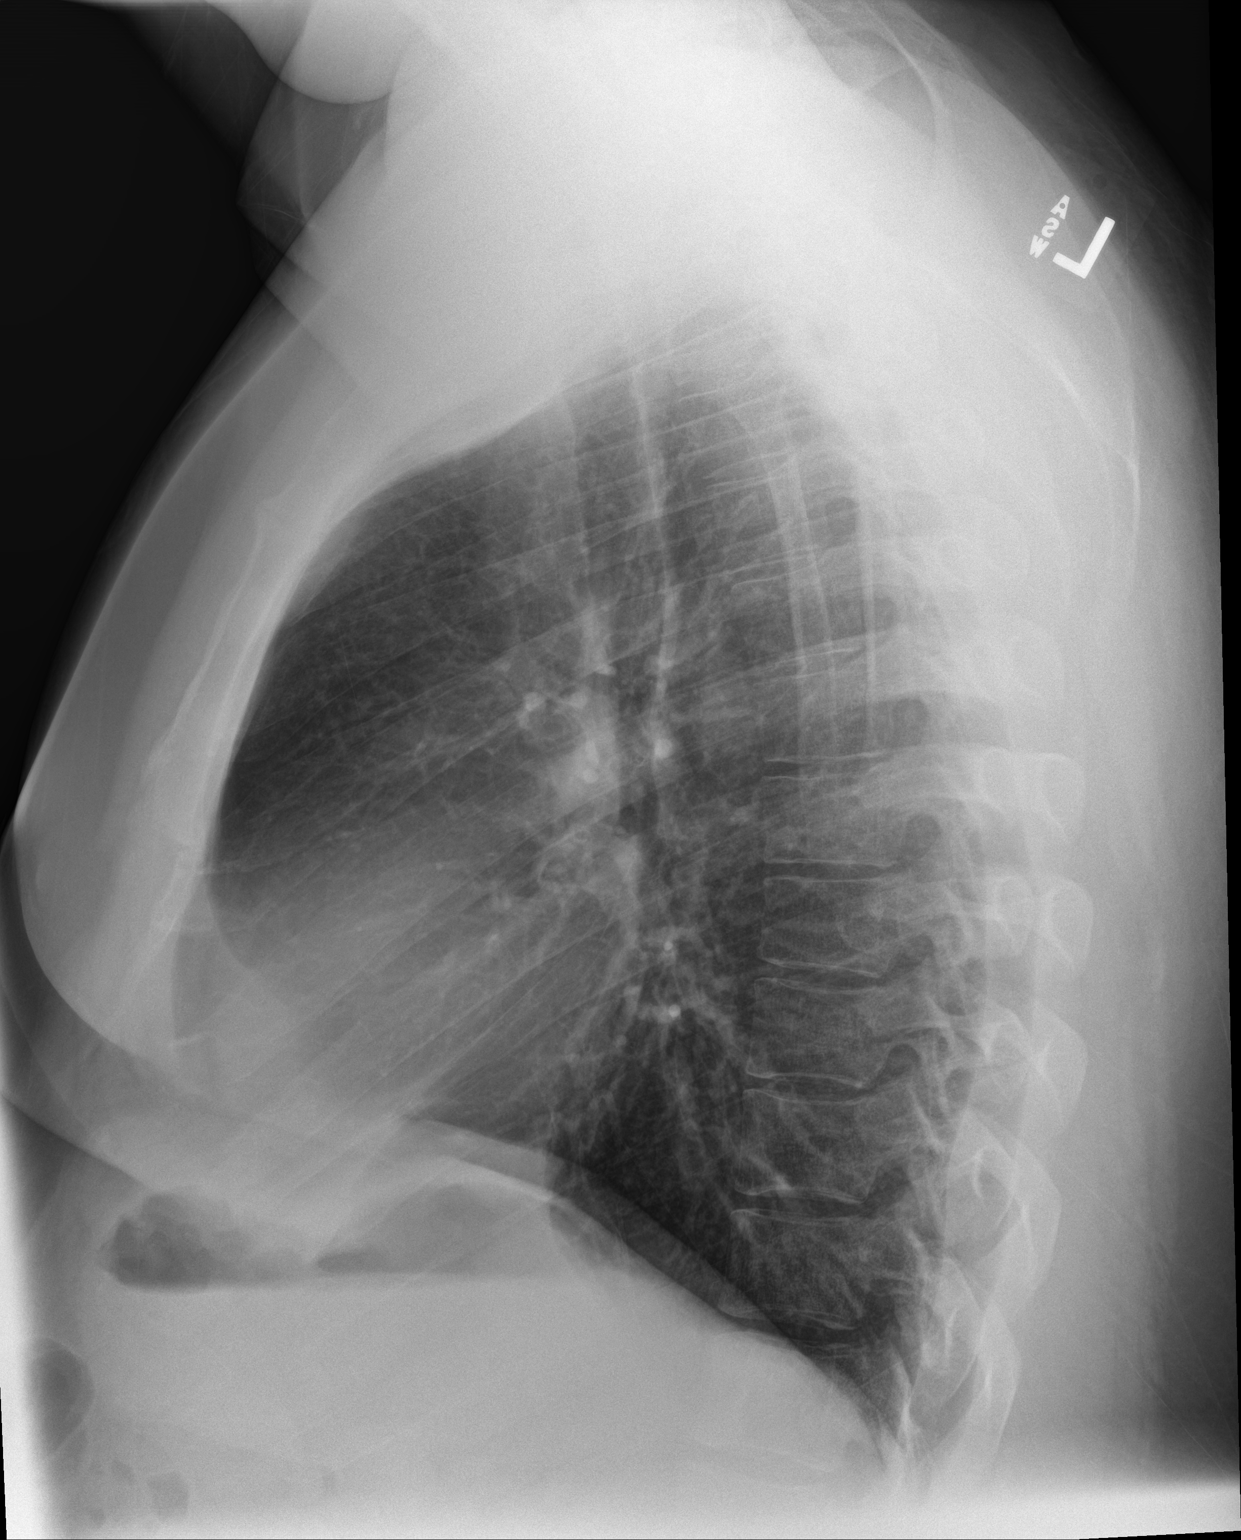

[2 of 2 positions shown; findings below may reference images not displayed]

FINDINGS: The heart size and mediastinal contours are within normal limits.
Both lungs are clear. The visualized skeletal structures are
unremarkable.
IMPRESSION: No active cardiopulmonary disease.

## 2023-05-21 ENCOUNTER — Ambulatory Visit: Payer: Self-pay | Admitting: Family Medicine

## 2023-05-21 ENCOUNTER — Telehealth: Payer: Self-pay

## 2023-05-21 NOTE — Telephone Encounter (Signed)
 Spoke with pt about the NO SHOW policy and the amount of same day canceled appts.  Currently pt has canceled 8 appts in a row was suppose to FU in 4 weeks around 12/21/22.   I advised pt that I would discussed with Dr. Arabella Beach to see if he would be willing to refill medications again prior to appt once refill request comes in.   I explained how its important to keep scheduled appts.

## 2023-06-05 ENCOUNTER — Ambulatory Visit: Payer: Self-pay | Admitting: Family Medicine

## 2023-06-07 ENCOUNTER — Encounter: Payer: Self-pay | Admitting: Family Medicine

## 2023-06-07 ENCOUNTER — Other Ambulatory Visit: Payer: Self-pay | Admitting: Family Medicine

## 2023-06-07 ENCOUNTER — Other Ambulatory Visit: Payer: Self-pay | Admitting: Nurse Practitioner

## 2023-06-07 DIAGNOSIS — E1159 Type 2 diabetes mellitus with other circulatory complications: Secondary | ICD-10-CM

## 2023-06-07 DIAGNOSIS — E119 Type 2 diabetes mellitus without complications: Secondary | ICD-10-CM

## 2023-06-07 MED ORDER — GLIPIZIDE ER 2.5 MG PO TB24: 2.5000 mg | ORAL_TABLET | Freq: Every day | ORAL | 1 refills | Status: DC

## 2023-07-12 ENCOUNTER — Other Ambulatory Visit: Payer: Self-pay

## 2023-07-12 ENCOUNTER — Ambulatory Visit (INDEPENDENT_AMBULATORY_CARE_PROVIDER_SITE_OTHER): Payer: Self-pay | Admitting: Family Medicine

## 2023-07-12 ENCOUNTER — Encounter: Payer: Self-pay | Admitting: Family Medicine

## 2023-07-12 VITALS — BP 145/80 | HR 82 | Ht 72.0 in | Wt 287.0 lb

## 2023-07-12 DIAGNOSIS — R42 Dizziness and giddiness: Secondary | ICD-10-CM

## 2023-07-12 DIAGNOSIS — E1159 Type 2 diabetes mellitus with other circulatory complications: Secondary | ICD-10-CM

## 2023-07-12 DIAGNOSIS — E119 Type 2 diabetes mellitus without complications: Secondary | ICD-10-CM

## 2023-07-12 DIAGNOSIS — Z5971 Insufficient health insurance coverage: Secondary | ICD-10-CM

## 2023-07-12 DIAGNOSIS — M5412 Radiculopathy, cervical region: Secondary | ICD-10-CM

## 2023-07-12 DIAGNOSIS — I152 Hypertension secondary to endocrine disorders: Secondary | ICD-10-CM

## 2023-07-12 DIAGNOSIS — Z91010 Allergy to peanuts: Secondary | ICD-10-CM

## 2023-07-12 DIAGNOSIS — L309 Dermatitis, unspecified: Secondary | ICD-10-CM

## 2023-07-12 DIAGNOSIS — J300 Vasomotor rhinitis: Secondary | ICD-10-CM

## 2023-07-12 DIAGNOSIS — J4520 Mild intermittent asthma, uncomplicated: Secondary | ICD-10-CM

## 2023-07-12 DIAGNOSIS — J019 Acute sinusitis, unspecified: Secondary | ICD-10-CM

## 2023-07-12 LAB — POCT GLYCOSYLATED HEMOGLOBIN (HGB A1C): HbA1c POC (<> result, manual entry): 8.7 % (ref 4.0–5.6)

## 2023-07-12 MED ORDER — ATENOLOL 100 MG PO TABS
100.0000 mg | ORAL_TABLET | Freq: Every day | ORAL | 3 refills | Status: DC
  Filled 2023-07-12 – 2023-08-06 (×2): qty 90, 90d supply, fill #0
  Filled 2023-10-29 (×2): qty 90, 90d supply, fill #1

## 2023-07-12 MED ORDER — AZELASTINE HCL 137 MCG/SPRAY NA SOLN
NASAL | 3 refills | Status: AC
  Filled 2023-07-12: qty 30, 7d supply, fill #0

## 2023-07-12 MED ORDER — ALBUTEROL SULFATE (2.5 MG/3ML) 0.083% IN NEBU
2.5000 mg | INHALATION_SOLUTION | Freq: Four times a day (QID) | RESPIRATORY_TRACT | 12 refills | Status: AC | PRN
  Filled 2023-07-12: qty 75, 7d supply, fill #0

## 2023-07-12 MED ORDER — TRIAMCINOLONE ACETONIDE 0.1 % EX OINT
TOPICAL_OINTMENT | CUTANEOUS | 1 refills | Status: AC
  Filled 2023-07-12 – 2023-09-10 (×2): qty 454, 30d supply, fill #0

## 2023-07-12 MED ORDER — LOSARTAN POTASSIUM 100 MG PO TABS
100.0000 mg | ORAL_TABLET | Freq: Every day | ORAL | 0 refills | Status: DC
  Filled 2023-07-12 – 2023-09-10 (×2): qty 90, 90d supply, fill #0

## 2023-07-12 MED ORDER — FLUTICASONE PROPIONATE 50 MCG/ACT NA SUSP
2.0000 | Freq: Every day | NASAL | 4 refills | Status: AC
  Filled 2023-07-12: qty 16, 30d supply, fill #0

## 2023-07-12 MED ORDER — PIOGLITAZONE HCL 30 MG PO TABS
30.0000 mg | ORAL_TABLET | Freq: Every day | ORAL | 2 refills | Status: AC
  Filled 2023-07-12: qty 30, 30d supply, fill #0

## 2023-07-12 MED ORDER — VENTOLIN HFA 108 (90 BASE) MCG/ACT IN AERS
2.0000 | INHALATION_SPRAY | Freq: Four times a day (QID) | RESPIRATORY_TRACT | 5 refills | Status: AC | PRN
Start: 2023-07-12 — End: ?
  Filled 2023-07-12: qty 18, 30d supply, fill #0
  Filled 2023-08-06 – 2023-09-10 (×2): qty 18, 25d supply, fill #0
  Filled 2024-01-03: qty 18, 25d supply, fill #1

## 2023-07-12 MED ORDER — GLIPIZIDE ER 5 MG PO TB24
5.0000 mg | ORAL_TABLET | Freq: Every day | ORAL | 1 refills | Status: DC
  Filled 2023-07-12: qty 90, 90d supply, fill #0

## 2023-07-12 MED ORDER — SPIRONOLACTONE 25 MG PO TABS
25.0000 mg | ORAL_TABLET | Freq: Every day | ORAL | 3 refills | Status: AC
  Filled 2023-07-12: qty 90, 90d supply, fill #0

## 2023-07-12 MED ORDER — FLUTICASONE-SALMETEROL 100-50 MCG/ACT IN AEPB
1.0000 | INHALATION_SPRAY | Freq: Two times a day (BID) | RESPIRATORY_TRACT | 3 refills | Status: AC
  Filled 2023-07-12 – 2023-08-06 (×3): qty 60, 30d supply, fill #0
  Filled 2023-10-29 – 2023-11-13 (×2): qty 60, 30d supply, fill #1

## 2023-07-12 NOTE — Progress Notes (Unsigned)
 Established Patient Office Visit  Subjective   Patient ID: Miguel Kent, male    DOB: [birth year]  Age: 33 y.o. MRN: [id number]  Chief Complaint  Patient presents with   Medical Management of Chronic Issues    HPI  Subjective - Chest tightness at night, sometimes wakes up coughing with mucus from sinus infections - Previously prescribed amoxicillin  for sinus infections - Last ENT visit September 2024 with CT scan showing deviated nasal septum and rhinosinusitis - ENT suggested deviated septum surgery, but advised it would not fix sinus infections - ENT diagnosed swollen turbinates and inferior turbinate issues - Uses Flonase  nasal spray intermittently, not daily - No regular use of Claritin or Zyrtec  - Difficulty breathing when nose is congested - Reports CPAP use difficult due to deviated septum - Mentions balance issues after using CPAP  - does not tolerate metformin .  Uninsured and cannot afford sglt2 or glp1. On glipizide .  Agreeable to adding pioglitazone   Medications: Advair  (maintenance inhaler) twice daily, albuterol  (rescue inhaler), azelastine  nasal spray, Flonase  nasal spray intermittently, losartan  (cutting 100mg  tablets in half), atenolol  daily, glipizide , albuterol  nebulizer solution, EpiPen , meclizine  as needed for vertigo.  PMH: Asthma, hypertension, diabetes, deviated septum, rhinosinusitis, sleep apnea.  ROS: Positive for chest tightness, nasal congestion, cough with mucus production, neck pain with movement causing prickly sensation and feeling of falling backward.    The ASCVD Risk score (Arnett DK, et al., 2019) failed to calculate for the following reasons:   The 2019 ASCVD risk score is only valid for ages 28 to 38  Health Maintenance Due  Topic Date Due   FOOT EXAM  Never done   OPHTHALMOLOGY EXAM  Never done   HIV Screening  Never done   Hepatitis C Screening  Never done   Pneumococcal Vaccine 2-7 Years old (1 of 2 - PCV) Never done   HPV  VACCINES (1 - Risk 3-dose SCDM series) Never done   Diabetic kidney evaluation - eGFR measurement  08/15/2022   COVID-19 Vaccine (1 - 2024-25 season) Never done      Objective:     BP (!) 145/80   Pulse 82   Ht 6' (1.829 m)   Wt 287 lb (130.2 kg)   SpO2 97%   BMI 38.92 kg/m    Physical Exam Gen: alert, oriented Cv: rrr Pulm: lctab Gi: soft, nbs.  Ext: no pedal edema   Results for orders placed or performed in visit on 07/12/23  Urine Microalbumin w/creat. ratio  Result Value Ref Range   Creatinine, Urine 396.7 Not Estab. mg/dL   Microalbumin, Urine 23.5 Not Estab. ug/mL   Microalb/Creat Ratio 19 0 - 29 mg/g creat  POCT glycosylated hemoglobin (Hb A1C)  Result Value Ref Range   Hemoglobin A1C     HbA1c POC (<> result, manual entry) 8.7 4.0 - 5.6 %   HbA1c, POC (prediabetic range)     HbA1c, POC (controlled diabetic range)          Assessment & Plan:   Hypertension associated with diabetes (HCC) Assessment & Plan: Hypertension: Currently on losartan  (cutting 100mg  tablets in half), atenolol  daily, and spironolactone  (reports possible headaches with this medication). Blood pressure today 140s.  - Continue losartan  50mg  (cutting 100mg  tablets) daily - Continue atenolol  daily - continue spironolactone , but advised to stop if causing headaches   Mild intermittent asthma, unspecified whether complicated Assessment & Plan:  Uses Advair  maintenance inhaler twice daily and albuterol  rescue inhaler. Also uses albuterol  nebulizer solution. - Continue  current asthma medications - Refilled all inhalers  Orders: -     Albuterol  Sulfate; Take 3 mLs (2.5 mg total) by nebulization every 6 (six) hours as needed for wheezing or shortness of breath.  Dispense: 75 mL; Refill: 12 -     Fluticasone -Salmeterol; Inhale 1 puff into the lungs 2 (two) times daily.  Dispense: 60 each; Refill: 3 -     Ventolin  HFA; Inhale 2 puffs into the lungs 4 (four) times daily as needed for  wheezing and shortness of breath  Dispense: 18 g; Refill: 5  Hypertension associated with type 2 diabetes mellitus (HCC) -     Atenolol ; Take 1 tablet (100 mg total) by mouth daily.  Dispense: 90 tablet; Refill: 3 -     Losartan  Potassium; Take 1 tablet (100 mg total) by mouth daily.  Dispense: 90 tablet; Refill: 0  Acute non-recurrent sinusitis, unspecified location -     Azelastine  HCl; USE 1 SPRAY(S) IN EACH NOSTRIL TWICE DAILY AS DIRECTED FOR 7 DAYS  Dispense: 60 mL; Refill: 3  Type 2 diabetes mellitus without complication, without long-term current use of insulin (HCC) Assessment & Plan: A1C today 8.7, elevated from previous readings. Reports glipizide  causes constipation. Previously tried Metformin  but experienced constant diarrhea. Has not tried Jardiance  due to concerns about interaction with diuretics. - Increase glipizide  to 5mg  daily - Start pioglitazone  15mg  daily (prescribing 30mg  tablets to cut in half) - Counseled on rare risk of heart injury with pioglitazone   Orders: -     glipiZIDE  ER; Take 1 tablet (5 mg total) by mouth daily with breakfast.  Dispense: 90 tablet; Refill: 1 -     POCT glycosylated hemoglobin (Hb A1C) -     Microalbumin / creatinine urine ratio -     AMB Referral [facility] Care Management  History of peanut allergy  Vasomotor rhinitis Assessment & Plan: - Recommended daily use of Flonase  - Refilled Flonase  and azelastine  nasal sprays  Orders: -     Fluticasone  Propionate; Place 2 sprays into both nostrils daily.  Dispense: 16 g; Refill: 4  Vertigo  Eczema, unspecified type -     Triamcinolone  Acetonide; APPLY ONE APPLICATION TOPICALLY TWICE DAILY  Dispense: 454 g; Refill: 1  Cervical radiculopathy Assessment & Plan:  Reports neck pain with movement, prickly sensation, and feeling of falling backward when turning head. - Recommended gentle neck stretches, heat therapy, and over-the-counter Voltaren gel - Discussed importance of proper pillow  support for side sleeping   Under or uninsured Assessment & Plan: No insurance, paying out-of-pocket for medications. - Sent prescriptions to [facility] pharmacy which offers discount medication program - Referred to [facility] coordinator for medication assistance - Provided information about Medicaid application through Social Security Administration - Discussed [facility] program for brand-name medications   Other orders -     Spironolactone ; Take 1 tablet (25 mg total) by mouth daily.  Dispense: 90 tablet; Refill: 3 -     Pioglitazone  HCl; Take 1 tablet (30 mg total) by mouth daily.  Dispense: 30 tablet; Refill: 2     Return in about 3 months (around 10/12/2023) for DM.    Toribio MARLA Slain, MD

## 2023-07-12 NOTE — Patient Instructions (Addendum)
 It was nice to see you today,  We addressed the following topics today: -I have sent all of your medications to [facility].  This is located near [facility] in a separate building. - If you want me to resend it to [facility] instead let me know. - I am sending in a referral to a value-based care coordinator who can help you with medication affordability issues.  Someone will call you to schedule this it is a virtual/phone call. - I have started you on pioglitazone for your diabetes, your A1c was 8.7 today.  I also increased your glipizide  to 5 mg.  I have included the 30 mg dose of pioglitazone but I would like you to start by taking half of a tablet daily instead of a full tablet. - To apply for Medicaid you should reach out to the Social Security administration office.  You can also search online and I believe you can apply online.  Have a great day,  Etha Henle, MD

## 2023-07-13 LAB — MICROALBUMIN / CREATININE URINE RATIO
Creatinine, Urine: 396.7 mg/dL
Microalb/Creat Ratio: 19 mg/g{creat} (ref 0–29)
Microalbumin, Urine: 76.4 ug/mL

## 2023-07-19 DIAGNOSIS — M5412 Radiculopathy, cervical region: Secondary | ICD-10-CM | POA: Insufficient documentation

## 2023-07-19 DIAGNOSIS — Z5971 Insufficient health insurance coverage: Secondary | ICD-10-CM | POA: Insufficient documentation

## 2023-07-19 NOTE — Assessment & Plan Note (Signed)
 Uses Advair  maintenance inhaler twice daily and albuterol  rescue inhaler. Also uses albuterol  nebulizer solution. - Continue current asthma medications - Refilled all inhalers

## 2023-07-19 NOTE — Assessment & Plan Note (Signed)
-   Recommended daily use of Flonase  - Refilled Flonase  and azelastine  nasal sprays

## 2023-07-19 NOTE — Assessment & Plan Note (Signed)
 A1C today 8.7, elevated from previous readings. Reports glipizide  causes constipation. Previously tried Metformin  but experienced constant diarrhea. Has not tried Jardiance  due to concerns about interaction with diuretics. - Increase glipizide  to 5mg  daily - Start pioglitazone  15mg  daily (prescribing 30mg  tablets to cut in half) - Counseled on rare risk of heart injury with pioglitazone 

## 2023-07-19 NOTE — Assessment & Plan Note (Signed)
 No insurance, paying out-of-pocket for medications. - Sent prescriptions to [facility] pharmacy which offers discount medication program - Referred to [facility] coordinator for medication assistance - Provided information about Medicaid application through Social Security Administration - Discussed [facility] program for brand-name medications

## 2023-07-19 NOTE — Assessment & Plan Note (Signed)
 Reports neck pain with movement, prickly sensation, and feeling of falling backward when turning head. - Recommended gentle neck stretches, heat therapy, and over-the-counter Voltaren gel - Discussed importance of proper pillow support for side sleeping

## 2023-07-19 NOTE — Assessment & Plan Note (Signed)
 Hypertension: Currently on losartan  (cutting 100mg  tablets in half), atenolol  daily, and spironolactone  (reports possible headaches with this medication). Blood pressure today 140s.  - Continue losartan  50mg  (cutting 100mg  tablets) daily - Continue atenolol  daily - continue spironolactone , but advised to stop if causing headaches

## 2023-07-24 ENCOUNTER — Other Ambulatory Visit: Payer: Self-pay

## 2023-07-26 ENCOUNTER — Telehealth: Payer: Self-pay

## 2023-07-26 ENCOUNTER — Other Ambulatory Visit: Payer: Self-pay

## 2023-07-26 NOTE — Progress Notes (Signed)
   07/26/2023  Patient ID: Miguel Kent, male   DOB: 18-Jan-1991, 33 y.o.   MRN: [id number]  Attempted to reach patient regarding medication assistance coordination. Did not answer, left message for callback to schedule a visit with me.   Spoke with Pam at [facility] outpatient pharmacy. Meds would go through [facility], or [facility] discount drug list or they would work with PAP advocate team to coordinate patient assistance where needed.   At this time patient has not picked up anything from [facility] community pharmacy.   Lang Sieve, PharmD, BCGP Clinical Pharmacist  [phone]

## 2023-08-06 ENCOUNTER — Other Ambulatory Visit: Payer: Self-pay

## 2023-08-06 ENCOUNTER — Other Ambulatory Visit: Payer: Self-pay | Admitting: Nurse Practitioner

## 2023-08-06 DIAGNOSIS — J4520 Mild intermittent asthma, uncomplicated: Secondary | ICD-10-CM

## 2023-09-10 ENCOUNTER — Other Ambulatory Visit: Payer: Self-pay

## 2023-10-12 ENCOUNTER — Ambulatory Visit: Payer: Self-pay | Admitting: Family Medicine

## 2023-10-12 ENCOUNTER — Other Ambulatory Visit: Payer: Self-pay

## 2023-10-12 VITALS — BP 139/89 | HR 77 | Ht 72.0 in | Wt 283.4 lb

## 2023-10-12 DIAGNOSIS — I152 Hypertension secondary to endocrine disorders: Secondary | ICD-10-CM

## 2023-10-12 DIAGNOSIS — E119 Type 2 diabetes mellitus without complications: Secondary | ICD-10-CM

## 2023-10-12 DIAGNOSIS — J309 Allergic rhinitis, unspecified: Secondary | ICD-10-CM

## 2023-10-12 DIAGNOSIS — E1159 Type 2 diabetes mellitus with other circulatory complications: Secondary | ICD-10-CM

## 2023-10-12 DIAGNOSIS — Z7984 Long term (current) use of oral hypoglycemic drugs: Secondary | ICD-10-CM

## 2023-10-12 LAB — POCT GLYCOSYLATED HEMOGLOBIN (HGB A1C): HbA1c POC (<> result, manual entry): 8.2 % (ref 4.0–5.6)

## 2023-10-12 MED ORDER — GLIPIZIDE ER 5 MG PO TB24
5.0000 mg | ORAL_TABLET | Freq: Two times a day (BID) | ORAL | 2 refills | Status: DC
  Filled 2023-10-12 – 2024-01-03 (×2): qty 180, 90d supply, fill #0

## 2023-10-12 MED ORDER — CANDESARTAN CILEXETIL 16 MG PO TABS: 16.0000 mg | ORAL_TABLET | Freq: Every day | ORAL | 3 refills | Status: DC

## 2023-10-12 NOTE — Patient Instructions (Signed)
 It was nice to see you today,  We addressed the following topics today: -I am switching your losartan  to candesartan .  This may help with headaches. - I am changing your glipizide  to twice a day at 5 mg.  Will treat you with meals and not on an empty stomach. - Someone, likely Lang Sieve, will call you regarding medication assistance options.  Have a great day,  Rolan Slain, MD

## 2023-10-12 NOTE — Progress Notes (Signed)
 Established Patient Office Visit  Subjective   Patient ID: Miguel Kent, male    DOB: May 18, 1990  Age: 33 y.o. MRN: [id number]  Chief Complaint  Patient presents with   Medical Management of Chronic Issues    HPI  Subjective - Reports cough and left ear blockage, attributes to weather changes. Not using fluticasone  spray daily. Has not taken Claritin or Zyrtec  recently. Took a sinus BC which contained Tylenol  and an antihistamine. Mucus was initially clear, then yellow, now green. Sensation of blockage from nose to left ear, described as sinus pressure or deafness.  - Regarding hypertension, not taking spironolactone . Reports fuzzy vision about an hour after taking losartan . Currently taking metoprolol and losartan  50 mg. Not spironolactone .  - Regarding diabetes, A1C was 8.2 (down from 8.7 three months ago). Reports sometimes feeling jittery but does not check blood sugars regularly. Has not started the prescribed pioglitazone  due to concerns about heart-related side effects.   - Reports no health insurance. Attempting to apply for Medicaid in a new county [facility]) after moving from [place], requiring the process to be restarted.    Medications Current medications include metoprolol, losartan  50 mg, and glipizide  once daily. Not taking spironolactone . Prescribed azelastine  spray previously but did not start. Not using fluticasone  spray regularly.  PMH, PSH, FH, Social Hx PMHx: Hypertension, type 2 diabetes, seasonal allergies. Social Hx: Uninsured. In the process of applying for Medicaid. Obtains prescriptions from [facility].  ROS HEENT: Reports left ear blockage, sinus pressure/congestion, productive cough with green mucus. Reports fuzzy vision. Denies headaches. Constitutional: Denies hypoglycemia symptoms but reports feeling jittery at times. Respiratory: Reports cough.     The ASCVD Risk score (Arnett DK, et al., 2019) failed to  calculate for the following reasons:   The 2019 ASCVD risk score is only valid for ages 29 to 61  Health Maintenance Due  Topic Date Due   FOOT EXAM  Never done   OPHTHALMOLOGY EXAM  Never done   HIV Screening  Never done   Hepatitis C Screening  Never done   Pneumococcal Vaccine (1 of 2 - PCV) Never done   HPV VACCINES (1 - Risk 3-dose SCDM series) Never done   Diabetic kidney evaluation - eGFR measurement  08/15/2022   COVID-19 Vaccine (1 - 2024-25 season) Never done      Objective:     BP 139/89   Pulse 77   Ht 6' (1.829 m)   Wt 283 lb 6.4 oz (128.5 kg)   SpO2 98%   BMI 38.44 kg/m    Physical Exam Gen: alert, oriented Heent: congested sounding Pulm: no resp distress Psych: pleasant affect   Results for orders placed or performed in visit on 10/12/23  POCT glycosylated hemoglobin (Hb A1C)  Result Value Ref Range   Hemoglobin A1C     HbA1c POC (<> result, manual entry) 8.2 4.0 - 5.6 %   HbA1c, POC (prediabetic range)     HbA1c, POC (controlled diabetic range)          Assessment & Plan:   Hypertension associated with type 2 diabetes mellitus (HCC) -     POCT glycosylated hemoglobin (Hb A1C)  Type 2 diabetes mellitus without complication, without long-term current use of insulin (HCC) Assessment & Plan: A1C remains elevated at 8.2 despite being improved from 8.7. Patient is hesitant to start pioglitazone  due to side effect concerns. - Increase glipizide  to 5 mg twice daily. Counseled to take with meals (e.g., breakfast and  dinner) and to not take on an empty stomach to avoid hypoglycemia. - Will hold pioglitazone  for now. - I will see if our pharmacist, Lang Sieve, contact the patient to discuss medication assistance options for more effective, affordable diabetes medications (e.g., GLP-1s, SGLT2s). A referral to the value-based care initiative was sent on 07/12/2023.  Orders: -     POCT glycosylated hemoglobin (Hb A1C) -     glipiZIDE  ER; Take 1 tablet  (5 mg total) by mouth 2 (two) times daily with a meal.  Dispense: 180 tablet; Refill: 2  Allergic sinusitis Assessment & Plan: Patient presents with symptoms of congestion, cough, left ear blockage, and productive green sputum, consistent with seasonal allergies and a possible secondary sinus infection. - Start augmentin . - Continue fluticasone  nasal spray, two sprays in each nostril daily. - Start daily over-the-counter antihistamine such as Claritin or Zyrtec . - Advised to follow up if symptoms do not improve.   Hypertension associated with diabetes (HCC) Assessment & Plan: Blood pressure remains elevated. Patient reports fuzzy vision possibly related to losartan . - Discontinue losartan . - Start candesartan  16 mg daily. Provided paper prescription to allow for price shopping. Counseled this medication may also help with headaches. If unaffordable, will resume losartan .   Other orders -     Candesartan  Cilexetil; Take 1 tablet (16 mg total) by mouth daily.  Dispense: 30 tablet; Refill: 3 -     Amoxicillin -Pot Clavulanate; Take 15 mLs (750 mg total) by mouth 2 (two) times daily.  Dispense: 210 mL; Refill: 0     Return in about 4 months (around 02/11/2024) for DM.    Toribio MARLA Slain, MD

## 2023-10-13 ENCOUNTER — Other Ambulatory Visit: Payer: Self-pay

## 2023-10-13 DIAGNOSIS — J309 Allergic rhinitis, unspecified: Secondary | ICD-10-CM | POA: Insufficient documentation

## 2023-10-13 MED ORDER — AMOXICILLIN-POT CLAVULANATE 250-62.5 MG/5ML PO SUSR
750.0000 mg | Freq: Two times a day (BID) | ORAL | 0 refills | Status: AC
  Filled 2023-10-13 – 2023-10-16 (×2): qty 210, 7d supply, fill #0

## 2023-10-13 NOTE — Assessment & Plan Note (Signed)
 A1C remains elevated at 8.2 despite being improved from 8.7. Patient is hesitant to start pioglitazone  due to side effect concerns. - Increase glipizide  to 5 mg twice daily. Counseled to take with meals (e.g., breakfast and dinner) and to not take on an empty stomach to avoid hypoglycemia. - Will hold pioglitazone  for now. - I will see if our pharmacist, Lang Sieve, contact the patient to discuss medication assistance options for more effective, affordable diabetes medications (e.g., GLP-1s, SGLT2s). A referral to the value-based care initiative was sent on 07/12/2023.

## 2023-10-13 NOTE — Assessment & Plan Note (Signed)
 Patient presents with symptoms of congestion, cough, left ear blockage, and productive green sputum, consistent with seasonal allergies and a possible secondary sinus infection. - Start augmentin . - Continue fluticasone  nasal spray, two sprays in each nostril daily. - Start daily over-the-counter antihistamine such as Claritin or Zyrtec . - Advised to follow up if symptoms do not improve.

## 2023-10-13 NOTE — Assessment & Plan Note (Signed)
 Blood pressure remains elevated. Patient reports fuzzy vision possibly related to losartan . - Discontinue losartan . - Start candesartan  16 mg daily. Provided paper prescription to allow for price shopping. Counseled this medication may also help with headaches. If unaffordable, will resume losartan .

## 2023-10-16 ENCOUNTER — Other Ambulatory Visit: Payer: Self-pay

## 2023-10-16 MED ORDER — AMOXICILLIN-POT CLAVULANATE 250-62.5 MG/5ML PO SUSR
15.0000 mL | Freq: Two times a day (BID) | ORAL | 0 refills | Status: AC
  Filled 2023-10-16: qty 300, 7d supply, fill #0

## 2023-10-17 ENCOUNTER — Other Ambulatory Visit: Payer: Self-pay

## 2023-10-19 ENCOUNTER — Other Ambulatory Visit: Payer: Self-pay

## 2023-10-29 ENCOUNTER — Other Ambulatory Visit: Payer: Self-pay

## 2023-10-31 ENCOUNTER — Other Ambulatory Visit: Payer: Self-pay

## 2023-10-31 MED ORDER — ATENOLOL 50 MG PO TABS
100.0000 mg | ORAL_TABLET | Freq: Every day | ORAL | 2 refills | Status: DC
  Filled 2023-10-31: qty 180, 90d supply, fill #0

## 2023-11-01 ENCOUNTER — Other Ambulatory Visit: Payer: Self-pay

## 2023-11-02 ENCOUNTER — Other Ambulatory Visit: Payer: Self-pay

## 2023-11-02 ENCOUNTER — Telehealth: Payer: Self-pay

## 2023-11-02 MED ORDER — VALSARTAN 80 MG PO TABS
80.0000 mg | ORAL_TABLET | Freq: Every day | ORAL | 3 refills | Status: DC
  Filled 2023-11-02: qty 90, 90d supply, fill #0

## 2023-11-02 NOTE — Progress Notes (Unsigned)
   11/02/2023  Patient ID: Miguel Kent, male   DOB: 1990/05/02, 33 y.o.   MRN: [id number]   Received message from Roxie Cos [facility] regarding patient's candesartan  not being available from [facility]/discount list. Pending order to PCP for potential change to Valsartan   Vianey Caniglia, PharmD, BCGP Clinical Pharmacist  [phone]

## 2023-11-07 ENCOUNTER — Other Ambulatory Visit: Payer: Self-pay

## 2023-11-08 ENCOUNTER — Other Ambulatory Visit: Payer: Self-pay

## 2023-11-13 ENCOUNTER — Other Ambulatory Visit: Payer: Self-pay

## 2023-11-16 ENCOUNTER — Other Ambulatory Visit: Payer: Self-pay

## 2023-11-19 ENCOUNTER — Other Ambulatory Visit: Payer: Self-pay

## 2023-11-21 ENCOUNTER — Other Ambulatory Visit: Payer: Self-pay

## 2024-01-03 ENCOUNTER — Other Ambulatory Visit: Payer: Self-pay

## 2024-01-03 MED ORDER — ALBUTEROL SULFATE HFA 108 (90 BASE) MCG/ACT IN AERS
2.0000 | INHALATION_SPRAY | Freq: Four times a day (QID) | RESPIRATORY_TRACT | 4 refills | Status: AC | PRN
  Filled 2024-01-03: qty 6.7, 10d supply, fill #0

## 2024-01-08 ENCOUNTER — Other Ambulatory Visit: Payer: Self-pay | Admitting: Family Medicine

## 2024-01-08 ENCOUNTER — Encounter: Payer: Self-pay | Admitting: Family Medicine

## 2024-01-08 MED ORDER — CANDESARTAN CILEXETIL 16 MG PO TABS: 16.0000 mg | ORAL_TABLET | Freq: Every day | ORAL | 2 refills | Status: AC

## 2024-01-10 ENCOUNTER — Other Ambulatory Visit: Payer: Self-pay

## 2024-01-14 ENCOUNTER — Other Ambulatory Visit: Payer: Self-pay

## 2024-01-15 ENCOUNTER — Encounter: Payer: Self-pay | Admitting: Family Medicine

## 2024-01-15 DIAGNOSIS — E119 Type 2 diabetes mellitus without complications: Secondary | ICD-10-CM

## 2024-01-15 MED ORDER — ATENOLOL 50 MG PO TABS: 100.0000 mg | ORAL_TABLET | Freq: Every day | ORAL | 2 refills | Status: AC

## 2024-01-15 MED ORDER — GLIPIZIDE ER 2.5 MG PO TB24: 5.0000 mg | ORAL_TABLET | Freq: Every day | ORAL | 2 refills | Status: AC

## 2024-02-08 ENCOUNTER — Ambulatory Visit: Payer: Self-pay | Admitting: Family Medicine

## 2024-02-22 ENCOUNTER — Ambulatory Visit: Payer: Self-pay | Admitting: Family Medicine

## 2024-02-28 ENCOUNTER — Ambulatory Visit: Payer: Self-pay | Admitting: Family Medicine

## 2024-02-29 ENCOUNTER — Other Ambulatory Visit: Payer: Self-pay

## 2024-03-28 ENCOUNTER — Ambulatory Visit: Payer: Self-pay | Admitting: Family Medicine
